# Patient Record
Sex: Female | Born: 1990 | Race: White | Hispanic: No | Marital: Married | Smoking: Current every day smoker
Health system: Southern US, Community
[De-identification: ages and names within clinical notes are randomized; demographics above are authoritative.]

## PROBLEM LIST (undated history)

## (undated) DIAGNOSIS — F99 Mental disorder, not otherwise specified: Secondary | ICD-10-CM

## (undated) DIAGNOSIS — E282 Polycystic ovarian syndrome: Secondary | ICD-10-CM

## (undated) HISTORY — DX: Polycystic ovarian syndrome: E28.2

## (undated) HISTORY — DX: Mental disorder, not otherwise specified: F99

---

## 2013-01-29 ENCOUNTER — Other Ambulatory Visit: Payer: Self-pay | Admitting: Adult Health

## 2013-03-25 ENCOUNTER — Other Ambulatory Visit: Payer: Self-pay | Admitting: Adult Health

## 2017-06-26 ENCOUNTER — Other Ambulatory Visit: Payer: Self-pay | Admitting: Women's Health

## 2017-06-26 ENCOUNTER — Other Ambulatory Visit (HOSPITAL_COMMUNITY)
Admission: RE | Admit: 2017-06-26 | Discharge: 2017-06-26 | Disposition: A | Discharge status: Home / Self Care | Payer: BLUE CROSS/BLUE SHIELD | Source: Ambulatory Visit | Attending: Obstetrics & Gynecology | Admitting: Obstetrics & Gynecology

## 2017-06-26 ENCOUNTER — Encounter: Payer: Self-pay | Admitting: Women's Health

## 2017-06-26 ENCOUNTER — Ambulatory Visit (INDEPENDENT_AMBULATORY_CARE_PROVIDER_SITE_OTHER): Payer: BLUE CROSS/BLUE SHIELD | Admitting: Women's Health

## 2017-06-26 ENCOUNTER — Encounter (INDEPENDENT_AMBULATORY_CARE_PROVIDER_SITE_OTHER): Payer: Self-pay

## 2017-06-26 VITALS — BP 134/80 | HR 85 | Ht 72.0 in | Wt 284.0 lb

## 2017-06-26 DIAGNOSIS — N911 Secondary amenorrhea: Secondary | ICD-10-CM | POA: Diagnosis not present

## 2017-06-26 DIAGNOSIS — R45851 Suicidal ideations: Secondary | ICD-10-CM | POA: Diagnosis not present

## 2017-06-26 DIAGNOSIS — Z01411 Encounter for gynecological examination (general) (routine) with abnormal findings: Secondary | ICD-10-CM | POA: Diagnosis not present

## 2017-06-26 DIAGNOSIS — Z113 Encounter for screening for infections with a predominantly sexual mode of transmission: Secondary | ICD-10-CM | POA: Insufficient documentation

## 2017-06-26 DIAGNOSIS — Z01419 Encounter for gynecological examination (general) (routine) without abnormal findings: Secondary | ICD-10-CM | POA: Insufficient documentation

## 2017-06-26 DIAGNOSIS — F418 Other specified anxiety disorders: Secondary | ICD-10-CM | POA: Diagnosis not present

## 2017-06-26 DIAGNOSIS — B373 Candidiasis of vulva and vagina: Secondary | ICD-10-CM

## 2017-06-26 DIAGNOSIS — N912 Amenorrhea, unspecified: Secondary | ICD-10-CM

## 2017-06-26 MED ORDER — CITALOPRAM HYDROBROMIDE 20 MG PO TABS: ORAL_TABLET | ORAL | 6 refills | Status: DC

## 2017-06-26 NOTE — Patient Instructions (Signed)
Major Depressive Disorder, Adult Major depressive disorder (MDD) is a mental health condition. It may also be called clinical depression or unipolar depression. MDD usually causes feelings of sadness, hopelessness, or helplessness. MDD can also cause physical symptoms. It can interfere with work, school, relationships, and other everyday activities. MDD may be mild, moderate, or severe. It may occur once (single episode major depressive disorder) or it may occur multiple times (recurrent major depressive disorder). What are the causes? The exact cause of this condition is not known. MDD is most likely caused by a combination of things, which may include:  Genetic factors. These are traits that are passed along from parent to child.  Individual factors. Your personality, your behavior, and the way you handle your thoughts and feelings may contribute to MDD. This includes personality traits and behaviors learned from others.  Physical factors, such as: ? Differences in the part of your brain that controls emotion. This part of your brain may be different than it is in people who do not have MDD. ? Long-term (chronic) medical or psychiatric illnesses.  Social factors. Traumatic experiences or major life changes may play a role in the development of MDD.  What increases the risk? This condition is more likely to develop in women. The following factors may also make you more likely to develop MDD:  A family history of depression.  Troubled family relationships.  Abnormally low levels of certain brain chemicals.  Traumatic events in childhood, especially abuse or the loss of a parent.  Being under a lot of stress, or long-term stress, especially from upsetting life experiences or losses.  A history of: ? Chronic physical illness. ? Other mental health disorders. ? Substance abuse.  Poor living conditions.  Experiencing social exclusion or discrimination on a regular basis.  What are  the signs or symptoms? The main symptoms of MDD typically include:  Constant depressed or irritable mood.  Loss of interest in things and activities.  MDD symptoms may also include:  Sleeping or eating too much or too little.  Unexplained weight change.  Fatigue or low energy.  Feelings of worthlessness or guilt.  Difficulty thinking clearly or making decisions.  Thoughts of suicide or of harming others.  Physical agitation or weakness.  Isolation.  Severe cases of MDD may also occur with other symptoms, such as:  Delusions or hallucinations, in which you imagine things that are not real (psychotic depression).  Low-level depression that lasts at least a year (chronic depression or persistent depressive disorder).  Extreme sadness and hopelessness (melancholic depression).  Trouble speaking and moving (catatonic depression).  How is this diagnosed? This condition may be diagnosed based on:  Your symptoms.  Your medical history, including your mental health history. This may involve tests to evaluate your mental health. You may be asked questions about your lifestyle, including any drug and alcohol use, and how long you have had symptoms of MDD.  A physical exam.  Blood tests to rule out other conditions.  You must have a depressed mood and at least four other MDD symptoms most of the day, nearly every day in the same 2-week timeframe before your health care provider can confirm a diagnosis of MDD. How is this treated? This condition is usually treated by mental health professionals, such as psychologists, psychiatrists, and clinical social workers. You may need more than one type of treatment. Treatment may include:  Psychotherapy. This is also called talk therapy or counseling. Types of psychotherapy include: ? Cognitive behavioral   therapy (CBT). This type of therapy teaches you to recognize unhealthy feelings, thoughts, and behaviors, and replace them with  positive thoughts and actions. ? Interpersonal therapy (IPT). This helps you to improve the way you relate to and communicate with others. ? Family therapy. This treatment includes members of your family.  Medicine to treat anxiety and depression, or to help you control certain emotions and behaviors.  Lifestyle changes, such as: ? Limiting alcohol and drug use. ? Exercising regularly. ? Getting plenty of sleep. ? Making healthy eating choices. ? Spending more time outdoors.  Treatments involving stimulation of the brain can be used in situations with extremely severe symptoms, or when medicine or other therapies do not work over time. These treatments include electroconvulsive therapy, transcranial magnetic stimulation, and vagal nerve stimulation. Follow these instructions at home: Activity  Return to your normal activities as told by your health care provider.  Exercise regularly and spend time outdoors as told by your health care provider. General instructions  Take over-the-counter and prescription medicines only as told by your health care provider.  Do not drink alcohol. If you drink alcohol, limit your alcohol intake to no more than 1 drink a day for nonpregnant women and 2 drinks a day for men. One drink equals 12 oz of beer, 5 oz of wine, or 1 oz of hard liquor. Alcohol can affect any antidepressant medicines you are taking. Talk to your health care provider about your alcohol use.  Eat a healthy diet and get plenty of sleep.  Find activities that you enjoy doing, and make time to do them.  Consider joining a support group. Your health care provider may be able to recommend a support group.  Keep all follow-up visits as told by your health care provider. This is important. Where to find more information: [employer] on Mental Illness  www.nami.org  [place] National Institute of Mental Health  https://carter.com/  National Suicide Prevention  Lifeline  1-800-273-TALK 202-208-2398). This is free, 24-hour help.  Contact a health care provider if:  Your symptoms get worse.  You develop new symptoms. Get help right away if:  You self-harm.  You have serious thoughts about hurting yourself or others.  You see, hear, taste, smell, or feel things that are not present (hallucinate). This information is not intended to replace advice given to you by your health care provider. Make sure you discuss any questions you have with your health care provider. Document Released: 12/21/2012 Document Revised: 05/02/2016 Document Reviewed: 03/06/2016 Elsevier Interactive Patient Education  2017 [place].   Generalized Anxiety Disorder, Adult Generalized anxiety disorder (GAD) is a mental health disorder. People with this condition constantly worry about everyday events. Unlike normal anxiety, worry related to GAD is not triggered by a specific event. These worries also do not fade or get better with time. GAD interferes with life functions, including relationships, work, and school. GAD can vary from mild to severe. People with severe GAD can have intense waves of anxiety with physical symptoms (panic attacks). What are the causes? The exact cause of GAD is not known. What increases the risk? This condition is more likely to develop in:  Women.  People who have a family history of anxiety disorders.  People who are very shy.  People who experience very stressful life events, such as the death of a loved one.  People who have a very stressful family environment.  What are the signs or symptoms? People with GAD often worry excessively about  many things in their lives, such as their health and family. They may also be overly concerned about:  Doing well at work.  Being on time.  Natural disasters.  Friendships.  Physical symptoms of GAD include:  Fatigue.  Muscle tension or having muscle twitches.  Trembling or feeling  shaky.  Being easily startled.  Feeling like your heart is pounding or racing.  Feeling out of breath or like you cannot take a deep breath.  Having trouble falling asleep or staying asleep.  Sweating.  Nausea, diarrhea, or irritable bowel syndrome (IBS).  Headaches.  Trouble concentrating or remembering facts.  Restlessness.  Irritability.  How is this diagnosed? Your health care provider can diagnose GAD based on your symptoms and medical history. You will also have a physical exam. The health care provider will ask specific questions about your symptoms, including how severe they are, when they started, and if they come and go. Your health care provider may ask you about your use of alcohol or drugs, including prescription medicines. Your health care provider may refer you to a mental health specialist for further evaluation. Your health care provider will do a thorough examination and may perform additional tests to rule out other possible causes of your symptoms. To be diagnosed with GAD, a person must have anxiety that:  Is out of his or her control.  Affects several different aspects of his or her life, such as work and relationships.  Causes distress that makes him or her unable to take part in normal activities.  Includes at least three physical symptoms of GAD, such as restlessness, fatigue, trouble concentrating, irritability, muscle tension, or sleep problems.  Before your health care provider can confirm a diagnosis of GAD, these symptoms must be present more days than they are not, and they must last for six months or longer. How is this treated? The following therapies are usually used to treat GAD:  Medicine. Antidepressant medicine is usually prescribed for long-term daily control. Antianxiety medicines may be added in severe cases, especially when panic attacks occur.  Talk therapy (psychotherapy). Certain types of talk therapy can be helpful in treating GAD  by providing support, education, and guidance. Options include: ? Cognitive behavioral therapy (CBT). People learn coping skills and techniques to ease their anxiety. They learn to identify unrealistic or negative thoughts and behaviors and to replace them with positive ones. ? Acceptance and commitment therapy (ACT). This treatment teaches people how to be mindful as a way to cope with unwanted thoughts and feelings. ? Biofeedback. This process trains you to manage your body's response (physiological response) through breathing techniques and relaxation methods. You will work with a therapist while machines are used to monitor your physical symptoms.  Stress management techniques. These include yoga, meditation, and exercise.  A mental health specialist can help determine which treatment is best for you. Some people see improvement with one type of therapy. However, other people require a combination of therapies. Follow these instructions at home:  Take over-the-counter and prescription medicines only as told by your health care provider.  Try to maintain a normal routine.  Try to anticipate stressful situations and allow extra time to manage them.  Practice any stress management or self-calming techniques as taught by your health care provider.  Do not punish yourself for setbacks or for not making progress.  Try to recognize your accomplishments, even if they are small.  Keep all follow-up visits as told by your health care provider. This is  important. Contact a health care provider if:  Your symptoms do not get better.  Your symptoms get worse.  You have signs of depression, such as: ? A persistently sad, cranky, or irritable mood. ? Loss of enjoyment in activities that used to bring you joy. ? Change in weight or eating. ? Changes in sleeping habits. ? Avoiding friends or family members. ? Loss of energy for normal tasks. ? Feelings of guilt or worthlessness. Get help right  away if:  You have serious thoughts about hurting yourself or others. If you ever feel like you may hurt yourself or others, or have thoughts about taking your own life, get help right away. You can go to your nearest emergency department or call:  Your local emergency services (911 in the [place]  A suicide crisis helpline, such as the National Suicide Prevention Lifeline at 1-800-273-8255. This is open 24 hours a day.  Summary  Generalized anxiety disorder (GAD) is a mental health disorder that involves worry that is not triggered by a specific event.  People with GAD often worry excessively about many things in their lives, such as their health and family.  GAD may cause physical symptoms such as restlessness, trouble concentrating, sleep problems, frequent sweating, nausea, diarrhea, headaches, and trembling or muscle twitching.  A mental health specialist can help determine which treatment is best for you. Some people see improvement with one type of therapy. However, other people require a combination of therapies. This information is not intended to replace advice given to you by your health care provider. Make sure you discuss any questions you have with your health care provider. Document Released: 12/21/2012 Document Revised: 07/16/2016 Document Reviewed: 07/16/2016 Elsevier Interactive Patient Education  2018 [employer]  

## 2017-06-26 NOTE — Progress Notes (Signed)
WELL-WOMAN EXAMINATION Patient name: Carolyn Bonilla MRN [id number]  Date of birth: 05-13-1991 Chief Complaint:   Gynecologic Exam and Amenorrhea (last period 12/22/15)  History of Present Illness:   Vercie Pokorny is a 26 y.o. G0P0000 Caucasian female being seen today for a routine well-woman exam.  Current complaints: menarche at 26yo, has only had 3 periods in 2 years, each spaced about apart. Last period 12/22/15. They last 4-5d, changes regular tampon q 4hrs, no clots, +cramping. Her older sister has never had a period. Not currently sexually active, but has been in the past.  Also reports depression/anxiety, was on meds and did counseling as teenager, nothing since. Doesn't find joy in things she used to, eats a lot, not able to sleep. Does have thoughts of harming herself, states she would not act on thoughts, has never attempted to harm self in past, has not developed a plan. Does want to try meds and counseling. Anxiety keeps her from coming to doctor/getting check-ups, etc.   Depression screen [facility] 2/9 06/26/2017  Decreased Interest 3  Down, Depressed, Hopeless 3  PHQ - 2 Score 6  Altered sleeping 3  Tired, decreased energy 3  Change in appetite 3  Feeling bad or failure about yourself  3  Trouble concentrating 1  Moving slowly or fidgety/restless 0  Suicidal thoughts 1  PHQ-9 Score 20  Difficult doing work/chores Somewhat difficult    PCP: Robbie Lis, hasn't been since she was younger      does desire labs Patient's last menstrual period was 12/22/2015. The current method of family planning is abstinence Last pap never. Results were: n/a Last mammogram: never. Results were: n/a Last colonoscopy: never. Results were: n/a  Review of Systems:   Pertinent items are noted in HPI Denies any headaches, blurred vision, fatigue, shortness of breath, chest pain, abdominal pain, abnormal vaginal discharge/itching/odor/irritation, problems with periods, bowel movements,  urination, or intercourse unless otherwise stated above. Pertinent History Reviewed:  Reviewed past medical,surgical, social and family history.  Reviewed problem list, medications and allergies. Physical Assessment:   Vitals:   06/26/17 1600  BP: 134/80  Pulse: 85  Weight: 284 lb (128.8 kg)  Height: 6' (1.829 m)  Body mass index is 38.52 kg/m.        Physical Examination:   General appearance - well appearing, and in no distress  Mental status - alert, oriented to person, place, and time  Psych:  She is very anxious, to point of slight hyperventilation and tearing up  Skin - warm and dry, normal color, no suspicious lesions noted  Chest - effort normal, all lung fields clear to auscultation bilaterally  Heart - normal rate and regular rhythm  Neck:  midline trachea, no thyromegaly or nodules  Breasts - breasts appear normal, no suspicious masses, no skin or nipple changes or  axillary nodes  Abdomen - soft, nontender, nondistended, no masses or organomegaly  Pelvic -   VULVA: entire mons very erythematous and demarcated- c/w yeast, painted w/ gentian violet   VAGINA: normal appearing vagina with normal color and discharge, no lesions  CERVIX: normal appearing cervix without discharge or lesions, no CMT  Thin prep pap is done w/ reflex HR HPV cotesting  UTERUS: unable to adequately assess d/t body habitus  ADNEXA: No adnexal masses or tenderness noted, unable to adequately assess d/t body habitus  Extremities:  No swelling or varicosities noted   Assessment & Plan:  1) Well-Woman Exam  2) Late menarche w/ oligomenorrhea  and subsequent secondary amenorrhea> will get pelvic u/s then discuss results/plan of care  3) Depression/anxiety w/ suicidal thoughts> no plan, promises not to act on thoughts, if feels she will she promises to go to ED. Rx celexa 10mg  daily x 7d then 20mg  daily thereafter, order placed for counseling/therapy w/ [facility]. Discussed stress/anxiety relieving   4)  Obesity> BMI 38  5) Vulvar candida> painted w/ gentian violet today   Labs/procedures today: labs listed below  Mammogram @26yo  or sooner if problems Colonoscopy @26yo  or sooner if problems  Orders Placed This Encounter  Procedures  . US PELVIS (TRANSABDOMINAL ONLY)  . US PELVIS TRANSVANGINAL NON-OB (TV ONLY)  . CBC  . Comprehensive metabolic panel  . TSH  . Hemoglobin A1c  . HIV antibody  . RPR  . Hepatitis B surface antigen  . Ambulatory referral to Behavioral Health    Follow-up: Return in about 2 weeks (around 07/10/2017) for US:GYN then f/u w/ me after.  Marge DuncansBooker, Will Heinkel Randall CNM, [facility] - Williams WayWHNP-BC 06/26/17 1630

## 2017-06-27 DIAGNOSIS — F418 Other specified anxiety disorders: Secondary | ICD-10-CM | POA: Insufficient documentation

## 2017-06-27 LAB — COMPREHENSIVE METABOLIC PANEL
A/G RATIO: 1.5 (ref 1.2–2.2)
ALK PHOS: 67 IU/L (ref 39–117)
ALT: 33 IU/L — AB (ref 0–32)
AST: 20 IU/L (ref 0–40)
Albumin: 4.6 g/dL (ref 3.5–5.5)
BILIRUBIN TOTAL: 0.5 mg/dL (ref 0.0–1.2)
BUN/Creatinine Ratio: 16 (ref 9–23)
BUN: 14 mg/dL (ref 6–20)
CHLORIDE: 104 mmol/L (ref 96–106)
CO2: 22 mmol/L (ref 20–29)
Calcium: 9.6 mg/dL (ref 8.7–10.2)
Creatinine, Ser: 0.85 mg/dL (ref 0.57–1.00)
GFR calc Af Amer: 110 mL/min/{1.73_m2} (ref >59)
GFR, EST NON AFRICAN AMERICAN: 96 mL/min/{1.73_m2} (ref >59)
GLOBULIN, TOTAL: 3 g/dL (ref 1.5–4.5)
Glucose: 81 mg/dL (ref 65–99)
POTASSIUM: 4.2 mmol/L (ref 3.5–5.2)
SODIUM: 143 mmol/L (ref 134–144)
Total Protein: 7.6 g/dL (ref 6.0–8.5)

## 2017-06-27 LAB — HEMOGLOBIN A1C
ESTIMATED AVERAGE GLUCOSE: 100 mg/dL
Hgb A1c MFr Bld: 5.1 % (ref 4.8–5.6)

## 2017-06-27 LAB — TSH: TSH: 0.709 u[IU]/mL (ref 0.450–4.500)

## 2017-06-27 LAB — CBC
HEMATOCRIT: 39.3 % (ref 34.0–46.6)
Hemoglobin: 13.1 g/dL (ref 11.1–15.9)
MCH: 27.8 pg (ref 26.6–33.0)
MCHC: 33.3 g/dL (ref 31.5–35.7)
MCV: 83 fL (ref 79–97)
Platelets: 313 10*3/uL (ref 150–379)
RBC: 4.72 x10E6/uL (ref 3.77–5.28)
RDW: 14.2 % (ref 12.3–15.4)
WBC: 10.4 10*3/uL (ref 3.4–10.8)

## 2017-06-27 LAB — RPR: RPR Ser Ql: NONREACTIVE

## 2017-06-27 LAB — HEPATITIS B SURFACE ANTIGEN: HEP B S AG: NEGATIVE

## 2017-06-27 LAB — HIV ANTIBODY (ROUTINE TESTING W REFLEX): HIV Screen 4th Generation wRfx: NONREACTIVE

## 2017-07-01 LAB — CYTOLOGY - PAP
Chlamydia: NEGATIVE
NEISSERIA GONORRHEA: NEGATIVE

## 2017-07-04 ENCOUNTER — Telehealth: Payer: Self-pay | Admitting: *Deleted

## 2017-07-04 NOTE — Telephone Encounter (Signed)
LMOVM to return call regarding pap results.  

## 2017-07-07 ENCOUNTER — Telehealth: Payer: Self-pay | Admitting: *Deleted

## 2017-07-07 NOTE — Telephone Encounter (Signed)
LMOVM for patient to callus back.  

## 2017-07-08 ENCOUNTER — Telehealth: Payer: Self-pay | Admitting: *Deleted

## 2017-07-08 NOTE — Telephone Encounter (Signed)
LMOVM to return call regarding appointment.

## 2017-07-08 NOTE — Telephone Encounter (Signed)
Informed patient of abnormal pap and need for colpo at next visit. Patient states she is fine to see Dr Emelda FearFerguson. Will move appointment.

## 2017-07-09 ENCOUNTER — Encounter: Payer: Self-pay | Admitting: Women's Health

## 2017-07-09 DIAGNOSIS — R87619 Unspecified abnormal cytological findings in specimens from cervix uteri: Secondary | ICD-10-CM | POA: Insufficient documentation

## 2017-07-10 ENCOUNTER — Other Ambulatory Visit: Payer: Self-pay | Admitting: Obstetrics and Gynecology

## 2017-07-10 ENCOUNTER — Ambulatory Visit: Payer: BLUE CROSS/BLUE SHIELD | Admitting: Adult Health

## 2017-07-10 ENCOUNTER — Ambulatory Visit (INDEPENDENT_AMBULATORY_CARE_PROVIDER_SITE_OTHER): Payer: BLUE CROSS/BLUE SHIELD | Admitting: Obstetrics and Gynecology

## 2017-07-10 ENCOUNTER — Ambulatory Visit (INDEPENDENT_AMBULATORY_CARE_PROVIDER_SITE_OTHER): Payer: BLUE CROSS/BLUE SHIELD

## 2017-07-10 VITALS — BP 130/66 | HR 88 | Ht 72.0 in | Wt 280.0 lb

## 2017-07-10 DIAGNOSIS — N912 Amenorrhea, unspecified: Secondary | ICD-10-CM | POA: Diagnosis not present

## 2017-07-10 DIAGNOSIS — R87612 Low grade squamous intraepithelial lesion on cytologic smear of cervix (LGSIL): Secondary | ICD-10-CM | POA: Diagnosis not present

## 2017-07-10 DIAGNOSIS — Z3202 Encounter for pregnancy test, result negative: Secondary | ICD-10-CM | POA: Diagnosis not present

## 2017-07-10 LAB — POCT URINE PREGNANCY: PREG TEST UR: NEGATIVE

## 2017-07-10 NOTE — Progress Notes (Signed)
PELVIC US TA/TV: homogeneous anteverted uterus,wnl,EEC 3.4 mm,slightly enlarged ovaries bilat w/peripherally located follicles,no free fluid,no pain during ultrasound,ovaries appear mobile

## 2017-07-10 NOTE — Progress Notes (Signed)
  Carolyn Bonilla Paige Braaksma 26 y.o. G0P0000 here for colposcopy for low-grade squamous intraepithelial neoplasia (LGSIL - encompassing HPV,mild dysplasia,CIN I) pap smear on 06/26/17. Discussed role for HPV in cervical dysplasia, need for surveillance.  Patient given informed consent, signed copy in the chart, time out was performed.  Placed in lithotomy position. Cervix viewed with speculum and colposcope after application of acetic acid.   Colposcopy adequate? Yes  no visible lesions, no mosaicism, no punctation and no abnormal vasculature; no biopsies obtained.  ECC specimen obtained. All specimens were labelled and sent to pathology.   Colposcopy IMPRESSION: No visible dysplasia ECC done F/u pap in 1 year  Patient was given post procedure instructions. Will follow up pathology and manage accordingly.  Routine preventative health maintenance measures emphasized.    By signing my name below, I, Izna Ahmed, attest that this documentation has been prepared under the direction and in the presence of Tilda BurrowFerguson, Taziyah Iannuzzi V., MD. Electronically Signed: Redge GainerIzna Ahmed, Medical Scribe. 07/10/17. 10:37 AM.  I personally performed the services described in this documentation, which was SCRIBED in my presence. The recorded information has been reviewed and considered accurate. It has been edited as necessary during review. Tilda BurrowFERGUSON,Britanee Vanblarcom V, MD

## 2017-07-22 ENCOUNTER — Encounter: Payer: Self-pay | Admitting: Women's Health

## 2017-07-22 ENCOUNTER — Ambulatory Visit (INDEPENDENT_AMBULATORY_CARE_PROVIDER_SITE_OTHER): Payer: BLUE CROSS/BLUE SHIELD | Admitting: Women's Health

## 2017-07-22 VITALS — BP 130/82 | HR 98 | Ht 72.0 in | Wt 279.6 lb

## 2017-07-22 DIAGNOSIS — N911 Secondary amenorrhea: Secondary | ICD-10-CM | POA: Diagnosis not present

## 2017-07-22 DIAGNOSIS — F418 Other specified anxiety disorders: Secondary | ICD-10-CM | POA: Diagnosis not present

## 2017-07-22 DIAGNOSIS — N913 Primary oligomenorrhea: Secondary | ICD-10-CM | POA: Diagnosis not present

## 2017-07-22 DIAGNOSIS — E282 Polycystic ovarian syndrome: Secondary | ICD-10-CM | POA: Diagnosis not present

## 2017-07-22 MED ORDER — MEDROXYPROGESTERONE ACETATE 10 MG PO TABS: 10.0000 mg | ORAL_TABLET | Freq: Every day | ORAL | 0 refills | Status: DC

## 2017-07-22 NOTE — Progress Notes (Signed)
GYN VISIT Patient name: Carolyn Bonilla MRN [id number]  Date of birth: 09/20/1990 Chief Complaint:   Follow-up (ultrasound done 07-10-17)  History of Present Illness:   Carolyn Bonilla is a 26 y.o. G0P0000 Caucasian female being seen today for f/u to discuss u/s results. Had pelvic u/s 07/10/17 due to menarche @ 26yo and only 3 periods in past 2 years, each about ago. Last period 12/22/15. Possibly desiring pregnancy in future. Is in relationship w/ transgender female, so pt would want to carry the pregnancy. Also had LSIL/HPV pap 06/26/17 w/ colpo w/ JVF on 07/10/17, normal colpo, ECC sample obtained and was benign. Also started pt on celexa  daily 06/26/17 d/t depression/anxiety w/ suicidal thoughts. Feeling much better. Denies SI/HI. Hasn't gone to counseling w/ [facility] as ordered, feels better.  Depression screen [facility] 2/9 07/22/2017 06/26/2017  Decreased Interest 0 3  Down, Depressed, Hopeless 0 3  PHQ - 2 Score 0 6  Altered sleeping 2 3  Tired, decreased energy 1 3  Change in appetite 1 3  Feeling bad or failure about yourself  0 3  Trouble concentrating 0 1  Moving slowly or fidgety/restless 0 0  Suicidal thoughts 0 1  PHQ-9 Score 4 20  Difficult doing work/chores Somewhat difficult Somewhat difficult    Pelvic u/s 07/10/17:  Chole Driver is a 26 y.o. G0P0000 LMP 12/28/2015,she is here for a pelvic sonogram for amenorrhea.  Uterus                      6.5 x 2.8 x 3.4 cm, vol 33 ml, homogeneous anteverted uterus,wnl, Endometrium          3.4 mm, symmetrical, wnl Right ovary             3.9 x 2.1 x 2.6 cm, 11 ml,enlarged w/peripherally located follicles Left ovary                2.5 x 3.5 x 2.1 cm, 9.7 ml,slightly enlarged w/peripherally located follicles  No free fluid   Technician Comments: PELVIC US TA/TV: homogeneous anteverted uterus,wnl,EEC 3.4 mm,slightly enlarged ovaries bilat w/peripherally located follicles,no free fluid,no pain during ultrasound,ovaries  appear mobile  [employer] 07/10/2017 10:43 AM Clinical Impression and recommendations:  I have reviewed the sonogram results above. Secondary amenorrhea in an obese female with history of late menarche Combined with the patient's current clinical course, below are my impressions and any appropriate recommendations for management based on the sonographic findings: 1. Tiny anteflexed uterus fundus of uterus appears was underdeveloped, measures 33 mL,, distinctly small with thin atrophic endometrium 2. Would consider FSH, TSH value prior to hormone manipulation 3. Consider progesterone withdrawal test, Provera 10 mg daily 14 days, to test end  organ function , uterus function FERGUSON,JOHN V    No LMP recorded. Patient is not currently having periods (Reason: Other). The current method of family planning is none. Last pap 06/26/17. Results were:  LSIL/HPV Review of Systems:   Pertinent items are noted in HPI Denies fever/chills, dizziness, headaches, visual disturbances, fatigue, shortness of breath, chest pain, abdominal pain, vomiting, abnormal vaginal discharge/itching/odor/irritation, problems with bowel movements, urination, or intercourse unless otherwise stated above.  Pertinent History Reviewed:  Reviewed past medical,surgical, social, obstetrical and family history.  Reviewed problem list, medications and allergies. Physical Assessment:   Vitals:   07/22/17 1028  BP: 130/82  Pulse: 98  Weight: 279 lb 9.6 oz (126.8 kg)  Height: 6' (1.829 m)  Body mass index is 37.92 kg/m.       Physical Examination:   General appearance: alert, well appearing, and in no distress  Mental status: alert, oriented to person, place, and time  Skin: warm & dry   Cardiovascular: normal heart rate noted   Respiratory: normal respiratory effort, no distress  Abdomen: soft, non-tender   Pelvic: examination not indicated  Extremities: no edema   No results found for this or any previous visit  (from the past 24 hour(s)).  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 Already checked TSH and A1C normal. Rx provera 10mg  daily x 14d. F/u 3wks, if bleeds- will either start coc's or cyclical provera q 3mths  2) PCOS> dx by oligo/amenorrhea and PCO on u/s  3) Dep/anx> much better on celexa 20mg  daily, no more SI  4) LSIL/HPV pap> w/ normal colpo and benign ECC, repeat pap 2873yr  Orders Placed This Encounter  Procedures  . Follicle stimulating hormone  . Prolactin   Return in about 3 weeks (around 08/12/2017) for F/U.  Marge DuncansBooker, [place] Randall CNM, [facility] 07/22/2017 12:38 PM

## 2017-07-22 NOTE — Patient Instructions (Signed)
Polycystic Ovarian Syndrome °Polycystic ovarian syndrome (PCOS) is a common hormonal disorder among women of reproductive age. In most women with PCOS, many small fluid-filled sacs (cysts) grow on the ovaries, and the cysts are not part of a normal menstrual cycle. PCOS can cause problems with your menstrual periods and make it difficult to get pregnant. It can also cause an increased risk of miscarriage with pregnancy. If it is not treated, PCOS can lead to serious health problems, such as diabetes and heart disease. °What are the causes? °The cause of PCOS is not known, but it may be the result of a combination of certain factors, such as: °· Irregular menstrual cycle. °· High levels of certain hormones (androgens). °· Problems with the hormone that helps to control blood sugar (insulin resistance). °· Certain genes. ° °What increases the risk? °This condition is more likely to develop in women who have a family history of PCOS. °What are the signs or symptoms? °Symptoms of PCOS may include: °· Multiple ovarian cysts. °· Infrequent periods or no periods. °· Periods that are too frequent or too heavy. °· Unpredictable periods. °· Inability to get pregnant (infertility) because of not ovulating. °· Increased growth of hair on the face, chest, stomach, back, thumbs, thighs, or toes. °· Acne or oily skin. Acne may develop during adulthood, and it may not respond to treatment. °· Pelvic pain. °· Weight gain or obesity. °· Patches of thickened and dark brown or black skin on the neck, arms, breasts, or thighs (acanthosis nigricans). °· Excess hair growth on the face, chest, abdomen, or upper thighs (hirsutism). ° °How is this diagnosed? °This condition is diagnosed based on: °· Your medical history. °· A physical exam, including a pelvic exam. Your health care provider may look for areas of increased hair growth on your skin. °· Tests, such as: °? Ultrasound. This may be used to examine the ovaries and the lining of the  uterus (endometrium) for cysts. °? Blood tests. These may be used to check levels of sugar (glucose), female hormone (testosterone), and female hormones (estrogen and progesterone) in your blood. ° °How is this treated? °There is no cure for PCOS, but treatment can help to manage symptoms and prevent more health problems from developing. Treatment varies depending on: °· Your symptoms. °· Whether you want to have a baby or whether you need birth control (contraception). ° °Treatment may include nutrition and lifestyle changes along with: °· Progesterone hormone to start a menstrual period. °· Birth control pills to help you have regular menstrual periods. °· Medicines to make you ovulate, if you want to get pregnant. °· Medicine to reduce excessive hair growth. °· Surgery, in severe cases. This may involve making small holes in one or both of your ovaries. This decreases the amount of testosterone that your body produces. ° °Follow these instructions at home: °· Take over-the-counter and prescription medicines only as told by your health care provider. °· Follow a healthy meal plan. This can help you reduce the effects of PCOS. °? Eat a healthy diet that includes lean proteins, complex carbohydrates, fresh fruits and vegetables, low-fat dairy products, and healthy fats. Make sure to eat enough fiber. °· If you are overweight, lose weight as told by your health care provider. °? Losing 10% of your body weight may improve symptoms. °? Your health care provider can determine how much weight loss is best for you and can help you lose weight safely. °· Keep all follow-up visits as told by   your health care provider. This is important. °Contact a health care provider if: °· Your symptoms do not get better with medicine. °· You develop new symptoms. °This information is not intended to replace advice given to you by your health care provider. Make sure you discuss any questions you have with your health care  provider. °Document Released: 12/20/2004 Document Revised: 04/23/2016 Document Reviewed: 02/11/2016 °Elsevier Interactive Patient Education © 2018 Elsevier Inc. ° °

## 2017-07-23 LAB — PROLACTIN: Prolactin: 17.5 ng/mL (ref 4.8–23.3)

## 2017-07-23 LAB — FOLLICLE STIMULATING HORMONE: FSH: 6.8 m[IU]/mL

## 2017-08-14 ENCOUNTER — Other Ambulatory Visit: Payer: Self-pay

## 2017-08-14 ENCOUNTER — Encounter: Payer: Self-pay | Admitting: Women's Health

## 2017-08-14 ENCOUNTER — Ambulatory Visit (INDEPENDENT_AMBULATORY_CARE_PROVIDER_SITE_OTHER): Payer: BLUE CROSS/BLUE SHIELD | Admitting: Women's Health

## 2017-08-14 VITALS — BP 120/82 | HR 89 | Ht 72.0 in | Wt 277.0 lb

## 2017-08-14 DIAGNOSIS — N911 Secondary amenorrhea: Secondary | ICD-10-CM | POA: Diagnosis not present

## 2017-08-14 DIAGNOSIS — N913 Primary oligomenorrhea: Secondary | ICD-10-CM

## 2017-08-14 DIAGNOSIS — E282 Polycystic ovarian syndrome: Secondary | ICD-10-CM

## 2017-08-14 MED ORDER — DESOGESTREL-ETHINYL ESTRADIOL 0.15-30 MG-MCG PO TABS: 1.0000 | ORAL_TABLET | Freq: Every day | ORAL | 3 refills | Status: DC

## 2017-08-14 NOTE — Patient Instructions (Signed)
Oral Contraception Use Oral contraceptive pills (OCPs) are medicines taken to prevent pregnancy. OCPs work by preventing the ovaries from releasing eggs. The hormones in OCPs also cause the cervical mucus to thicken, preventing the sperm from entering the uterus. The hormones also cause the uterine lining to become thin, not allowing a fertilized egg to attach to the inside of the uterus. OCPs are highly effective when taken exactly as prescribed. However, OCPs do not prevent sexually transmitted diseases (STDs). Safe sex practices, such as using condoms along with an OCP, can help prevent STDs. Before taking OCPs, you may have a physical exam and Pap test. Your health care provider may also order blood tests if necessary. Your health care provider will make sure you are a good candidate for oral contraception. Discuss with your health care provider the possible side effects of the OCP you may be prescribed. When starting an OCP, it can take 2 to 3 months for the body to adjust to the changes in hormone levels in your body. How to take oral contraceptive pills Your health care provider may advise you on how to start taking the first cycle of OCPs. Otherwise, you can:  Start on day 1 of your menstrual period. You will not need any backup contraceptive protection with this start time.  Start on the first Sunday after your menstrual period or the day you get your prescription. In these cases, you will need to use backup contraceptive protection for the first week.  Start the pill at any time of your cycle. If you take the pill within 5 days of the start of your period, you are protected against pregnancy right away. In this case, you will not need a backup form of birth control. If you start at any other time of your menstrual cycle, you will need to use another form of birth control for 7 days. If your OCP is the type called a minipill, it will protect you from pregnancy after taking it for 2 days (48  hours).  After you have started taking OCPs:  If you forget to take 1 pill, take it as soon as you remember. Take the next pill at the regular time.  If you miss 2 or more pills, call your health care provider because different pills have different instructions for missed doses. Use backup birth control until your next menstrual period starts.  If you use a 28-day pack that contains inactive pills and you miss 1 of the last 7 pills (pills with no hormones), it will not matter. Throw away the rest of the non-hormone pills and start a new pill pack.  No matter which day you start the OCP, you will always start a new pack on that same day of the week. Have an extra pack of OCPs and a backup contraceptive method available in case you miss some pills or lose your OCP pack. Follow these instructions at home:  Do not smoke.  Always use a condom to protect against STDs. OCPs do not protect against STDs.  Use a calendar to mark your menstrual period days.  Read the information and directions that came with your OCP. Talk to your health care provider if you have questions. Contact a health care provider if:  You develop nausea and vomiting.  You have abnormal vaginal discharge or bleeding.  You develop a rash.  You miss your menstrual period.  You are losing your hair.  You need treatment for mood swings or depression.  You   get dizzy when taking the OCP.  You develop acne from taking the OCP.  You become pregnant. Get help right away if:  You develop chest pain.  You develop shortness of breath.  You have an uncontrolled or severe headache.  You develop numbness or slurred speech.  You develop visual problems.  You develop pain, redness, and swelling in the legs. This information is not intended to replace advice given to you by your health care provider. Make sure you discuss any questions you have with your health care provider. Document Released: 08/15/2011 Document  Revised: 02/01/2016 Document Reviewed: 02/14/2013 Elsevier Interactive Patient Education  2017 Elsevier Inc.  

## 2017-08-14 NOTE — Progress Notes (Signed)
   GYN VISIT Patient name: Carolyn Bonilla MRN [id number]  Date of birth: 02/20/1991 Chief Complaint:   Follow-up  History of Present Illness:   Carolyn Bonilla is a 26 y.o. G0P0000 Caucasian female being seen today for f/u. She has late menarche at age 26, with oligomenorrhea and subsequent secondary amenorrhea, w/ PCOS. Last period was 12/22/15. Pelvic u/s showed underdeveloped uterus, and polycystic ovaries. All labs including FSH, prolactin, TSH, and A1C normal. After discussing w/ JVF and LHE, she was started on provera 10mg  x 14d at last visit on 11/13. States she did have a 1 week period that started 2d after she stopped the provera. Changed tampon q 2hrs, no clots, did have a lot of cramping. Per MD recommendation, next step is COCs or cyclical provera q 3mths. Pt wants COCs. Does smoke. No h/o HTN, DVT/PE, CVA, MI, or migraines w/ aura.  Doing well on celexa for dep/anx, feels much better.   No LMP recorded. Patient is not currently having periods (Reason: Other). The current method of family planning is none. Last pap 06/26/17. Results were:  LSIL/HPV w/ normal colpo Review of Systems:   Pertinent items are noted in HPI Denies fever/chills, dizziness, headaches, visual disturbances, fatigue, shortness of breath, chest pain, abdominal pain, vomiting, abnormal vaginal discharge/itching/odor/irritation, problems with periods, bowel movements, urination, or intercourse unless otherwise stated above.  Pertinent History Reviewed:  Reviewed past medical,surgical, social, obstetrical and family history.  Reviewed problem list, medications and allergies. Physical Assessment:   Vitals:   08/14/17 1037  BP: 120/82  Pulse: 89  Weight: 277 lb (125.6 kg)  Height: 6' (1.829 m)  Body mass index is 37.57 kg/m.       Physical Examination:   General appearance: alert, well appearing, and in no distress  Mental status: alert, oriented to person, place, and time  Skin: warm & dry    Cardiovascular: normal heart rate noted  Respiratory: normal respiratory effort, no distress  Abdomen: exam not indicateed  Pelvic: examination not indicated  Extremities: no edema   No results found for this or any previous visit (from the past 24 hour(s)).  Assessment & Plan:  1) Late menarche w/ oligomenorrhea>then subsequent amenorrhea> responded well to 14d provera challenge. Will begin COCs in attempt to start regular monthly periods- rx desogestrel/EE 30mcg COC 3pk w/ 3RF, f/u in 3mths  2) PCOS  3) Dep/anx> continue celexa 20mg  daily  No orders of the defined types were placed in this encounter.   Return in about 3 months (around 11/12/2017) for F/U.  Marge DuncansBooker, [name] CNM, [facility]NP-BC 08/14/2017 11:19 AM

## 2017-09-25 ENCOUNTER — Ambulatory Visit: Payer: BLUE CROSS/BLUE SHIELD | Admitting: Women's Health

## 2017-10-03 ENCOUNTER — Ambulatory Visit: Payer: BLUE CROSS/BLUE SHIELD | Admitting: Women's Health

## 2017-10-09 ENCOUNTER — Ambulatory Visit (INDEPENDENT_AMBULATORY_CARE_PROVIDER_SITE_OTHER): Payer: BLUE CROSS/BLUE SHIELD | Admitting: Women's Health

## 2017-10-09 ENCOUNTER — Encounter: Payer: Self-pay | Admitting: Women's Health

## 2017-10-09 VITALS — BP 120/80 | HR 99 | Ht 72.0 in | Wt 288.0 lb

## 2017-10-09 DIAGNOSIS — F418 Other specified anxiety disorders: Secondary | ICD-10-CM

## 2017-10-09 DIAGNOSIS — N911 Secondary amenorrhea: Secondary | ICD-10-CM | POA: Diagnosis not present

## 2017-10-09 MED ORDER — BUSPIRONE HCL 5 MG PO TABS: 5.0000 mg | ORAL_TABLET | Freq: Three times a day (TID) | ORAL | 6 refills | Status: DC

## 2017-10-09 NOTE — Patient Instructions (Signed)
Generalized Anxiety Disorder, Adult Generalized anxiety disorder (GAD) is a mental health disorder. People with this condition constantly worry about everyday events. Unlike normal anxiety, worry related to GAD is not triggered by a specific event. These worries also do not fade or get better with time. GAD interferes with life functions, including relationships, work, and school. GAD can vary from mild to severe. People with severe GAD can have intense waves of anxiety with physical symptoms (panic attacks). What are the causes? The exact cause of GAD is not known. What increases the risk? This condition is more likely to develop in:  Women.  People who have a family history of anxiety disorders.  People who are very shy.  People who experience very stressful life events, such as the death of a loved one.  People who have a very stressful family environment.  What are the signs or symptoms? People with GAD often worry excessively about many things in their lives, such as their health and family. They may also be overly concerned about:  Doing well at work.  Being on time.  Natural disasters.  Friendships.  Physical symptoms of GAD include:  Fatigue.  Muscle tension or having muscle twitches.  Trembling or feeling shaky.  Being easily startled.  Feeling like your heart is pounding or racing.  Feeling out of breath or like you cannot take a deep breath.  Having trouble falling asleep or staying asleep.  Sweating.  Nausea, diarrhea, or irritable bowel syndrome (IBS).  Headaches.  Trouble concentrating or remembering facts.  Restlessness.  Irritability.  How is this diagnosed? Your health care provider can diagnose GAD based on your symptoms and medical history. You will also have a physical exam. The health care provider will ask specific questions about your symptoms, including how severe they are, when they started, and if they come and go. Your health care  provider may ask you about your use of alcohol or drugs, including prescription medicines. Your health care provider may refer you to a mental health specialist for further evaluation. Your health care provider will do a thorough examination and may perform additional tests to rule out other possible causes of your symptoms. To be diagnosed with GAD, a person must have anxiety that:  Is out of his or her control.  Affects several different aspects of his or her life, such as work and relationships.  Causes distress that makes him or her unable to take part in normal activities.  Includes at least three physical symptoms of GAD, such as restlessness, fatigue, trouble concentrating, irritability, muscle tension, or sleep problems.  Before your health care provider can confirm a diagnosis of GAD, these symptoms must be present more days than they are not, and they must last for six months or longer. How is this treated? The following therapies are usually used to treat GAD:  Medicine. Antidepressant medicine is usually prescribed for long-term daily control. Antianxiety medicines may be added in severe cases, especially when panic attacks occur.  Talk therapy (psychotherapy). Certain types of talk therapy can be helpful in treating GAD by providing support, education, and guidance. Options include: ? Cognitive behavioral therapy (CBT). People learn coping skills and techniques to ease their anxiety. They learn to identify unrealistic or negative thoughts and behaviors and to replace them with positive ones. ? Acceptance and commitment therapy (ACT). This treatment teaches people how to be mindful as a way to cope with unwanted thoughts and feelings. ? Biofeedback. This process trains you to   manage your body's response (physiological response) through breathing techniques and relaxation methods. You will work with a therapist while machines are used to monitor your physical symptoms.  Stress  management techniques. These include yoga, meditation, and exercise.  A mental health specialist can help determine which treatment is best for you. Some people see improvement with one type of therapy. However, other people require a combination of therapies. Follow these instructions at home:  Take over-the-counter and prescription medicines only as told by your health care provider.  Try to maintain a normal routine.  Try to anticipate stressful situations and allow extra time to manage them.  Practice any stress management or self-calming techniques as taught by your health care provider.  Do not punish yourself for setbacks or for not making progress.  Try to recognize your accomplishments, even if they are small.  Keep all follow-up visits as told by your health care provider. This is important. Contact a health care provider if:  Your symptoms do not get better.  Your symptoms get worse.  You have signs of depression, such as: ? A persistently sad, cranky, or irritable mood. ? Loss of enjoyment in activities that used to bring you joy. ? Change in weight or eating. ? Changes in sleeping habits. ? Avoiding friends or family members. ? Loss of energy for normal tasks. ? Feelings of guilt or worthlessness. Get help right away if:  You have serious thoughts about hurting yourself or others. If you ever feel like you may hurt yourself or others, or have thoughts about taking your own life, get help right away. You can go to your nearest emergency department or call:  Your local emergency services (911 in the [place]  A suicide crisis helpline, such as the National Suicide Prevention Lifeline at 1-800-273-8255. This is open 24 hours a day.  Summary  Generalized anxiety disorder (GAD) is a mental health disorder that involves worry that is not triggered by a specific event.  People with GAD often worry excessively about many things in their lives, such as their health and  family.  GAD may cause physical symptoms such as restlessness, trouble concentrating, sleep problems, frequent sweating, nausea, diarrhea, headaches, and trembling or muscle twitching.  A mental health specialist can help determine which treatment is best for you. Some people see improvement with one type of therapy. However, other people require a combination of therapies. This information is not intended to replace advice given to you by your health care provider. Make sure you discuss any questions you have with your health care provider. Document Released: 12/21/2012 Document Revised: 07/16/2016 Document Reviewed: 07/16/2016 Elsevier Interactive Patient Education  2018 Elsevier Inc. Buspirone tablets What is this medicine? BUSPIRONE (byoo SPYE rone) is used to treat anxiety disorders. This medicine may be used for other purposes; ask your health care provider or pharmacist if you have questions. COMMON BRAND NAME(S): BuSpar What should I tell my health care provider before I take this medicine? They need to know if you have any of these conditions: -kidney or liver disease -an unusual or allergic reaction to buspirone, other medicines, foods, dyes, or preservatives -pregnant or trying to get pregnant -breast-feeding How should I use this medicine? Take this medicine by mouth with a glass of water. Follow the directions on the prescription label. You may take this medicine with or without food. To ensure that this medicine always works the same way for you, you should take it either always with or always without food.   Take your doses at regular intervals. Do not take your medicine more often than directed. Do not stop taking except on the advice of your doctor or health care professional. Talk to your pediatrician regarding the use of this medicine in children. Special care may be needed. Overdosage: If you think you have taken too much of this medicine contact a poison control center or  emergency room at once. NOTE: This medicine is only for you. Do not share this medicine with others. What if I miss a dose? If you miss a dose, take it as soon as you can. If it is almost time for your next dose, take only that dose. Do not take double or extra doses. What may interact with this medicine? Do not take this medicine with any of the following medications: -linezolid -MAOIs like Carbex, Eldepryl, Marplan, Nardil, and Parnate -methylene blue -procarbazine This medicine may also interact with the following medications: -diazepam -digoxin -diltiazem -erythromycin -grapefruit juice -haloperidol -medicines for mental depression or mood problems -medicines for seizures like carbamazepine, phenobarbital and phenytoin -nefazodone -other medications for anxiety -rifampin -ritonavir -some antifungal medicines like itraconazole, ketoconazole, and voriconazole -verapamil -warfarin This list may not describe all possible interactions. Give your health care provider a list of all the medicines, herbs, non-prescription drugs, or dietary supplements you use. Also tell them if you smoke, drink alcohol, or use illegal drugs. Some items may interact with your medicine. What should I watch for while using this medicine? Visit your doctor or health care professional for regular checks on your progress. It may take 1 to 2 weeks before your anxiety gets better. You may get drowsy or dizzy. Do not drive, use machinery, or do anything that needs mental alertness until you know how this drug affects you. Do not stand or sit up quickly, especially if you are an older patient. This reduces the risk of dizzy or fainting spells. Alcohol can make you more drowsy and dizzy. Avoid alcoholic drinks. What side effects may I notice from receiving this medicine? Side effects that you should report to your doctor or health care professional as soon as possible: -blurred vision or other vision changes -chest  pain -confusion -difficulty breathing -feelings of hostility or anger -muscle aches and pains -numbness or tingling in hands or feet -ringing in the ears -skin rash and itching -vomiting -weakness Side effects that usually do not require medical attention (report to your doctor or health care professional if they continue or are bothersome): -disturbed dreams, nightmares -headache -nausea -restlessness or nervousness -sore throat and nasal congestion -stomach upset This list may not describe all possible side effects. Call your doctor for medical advice about side effects. You may report side effects to FDA at 1-800-FDA-1088. Where should I keep my medicine? Keep out of the reach of children. Store at room temperature below 30 degrees C (86 degrees F). Protect from light. Keep container tightly closed. Throw away any unused medicine after the expiration date. NOTE: This sheet is a summary. It may not cover all possible information. If you have questions about this medicine, talk to your doctor, pharmacist, or health care provider.  2018 Elsevier/Gold Standard (2010-04-05 18:06:11)  

## 2017-10-09 NOTE — Progress Notes (Signed)
   GYN VISIT Patient name: Carolyn Bonilla MRN [id number]  Date of birth: 02/04/1991 Chief Complaint:   Follow-up (on Medications)  History of Present Illness:   Carolyn Bonilla is a 27 y.o. G0P0000 Caucasian female being seen today for f/u of secondary amenorrhea. She was started on coc's 08/14/17, she has had a regular period q month since! Was started on celexa 20mg  daily 06/26/17 d/t dep/anx, had been working well until she started the coc's. States she began to have suicidal ideations, so she stopped the celexa, and suicidal ideations have completely went away. Declines counseling as it is hard to schedule w/ work. Does feel like she needs to be on something for the anxiety. States she talked to her sister and she is on Buspar which works well for her, pt wants to try.      Patient's last menstrual period was 10/08/2017. The current method of family planning is OCP (estrogen/progesterone). Last pap 06/26/17. Results were:  LSIL/HPV w/ normal colpo Review of Systems:   Pertinent items are noted in HPI Denies fever/chills, dizziness, headaches, visual disturbances, fatigue, shortness of breath, chest pain, abdominal pain, vomiting, abnormal vaginal discharge/itching/odor/irritation, problems with periods, bowel movements, urination, or intercourse unless otherwise stated above.  Pertinent History Reviewed:  Reviewed past medical,surgical, social, obstetrical and family history.  Reviewed problem list, medications and allergies. Physical Assessment:   Vitals:   10/09/17 1152  BP: 120/80  Pulse: 99  Weight: 288 lb (130.6 kg)  Height: 6' (1.829 m)  Body mass index is 39.06 kg/m.       Physical Examination:   General appearance: alert, well appearing, and in no distress  Mental status: alert, oriented to person, place, and time  Skin: warm & dry   Cardiovascular: normal heart rate noted  Respiratory: normal respiratory effort, no distress  Abdomen: soft, non-tender   Pelvic:  examination not indicated  Extremities: no edema   No results found for this or any previous visit (from the past 24 hour(s)).  Assessment & Plan:  1) Secondary amenorrhea> resolved w/ COCs, continue  2) Dep/anxiety> do not restart celexa, rx buspar 5mg  BID-TID, f/u 4wks. Stop taking/let me know if suicidal ideations return. Declines counseling.   Meds:  Meds ordered this encounter  Medications  . busPIRone (BUSPAR) 5 MG tablet    Sig: Take 1 tablet (5 mg total) by mouth 3 (three) times daily.    Dispense:  90 tablet    Refill:  6    Order Specific Question:   Supervising Provider    Answer:   Duane LopeEURE, LUTHER H [[id number]]    No orders of the defined types were placed in this encounter.   Return in about 4 weeks (around 11/06/2017) for F/U.  Marge DuncansBooker, Kimberly Randall CNM, [facility]NP-BC 10/09/2017 12:19 PM

## 2017-11-12 ENCOUNTER — Ambulatory Visit: Payer: BLUE CROSS/BLUE SHIELD | Admitting: Women's Health

## 2017-11-17 ENCOUNTER — Other Ambulatory Visit: Payer: Self-pay | Admitting: Women's Health

## 2017-11-17 MED ORDER — BUSPIRONE HCL 10 MG PO TABS: 5.0000 mg | ORAL_TABLET | Freq: Three times a day (TID) | ORAL | 3 refills | Status: DC

## 2018-01-01 ENCOUNTER — Ambulatory Visit: Payer: BLUE CROSS/BLUE SHIELD | Admitting: Women's Health

## 2018-01-07 ENCOUNTER — Ambulatory Visit: Payer: BLUE CROSS/BLUE SHIELD | Admitting: Women's Health

## 2018-04-23 ENCOUNTER — Emergency Department (HOSPITAL_COMMUNITY)
Admission: EM | Admit: 2018-04-23 | Discharge: 2018-04-24 | Disposition: A | Discharge status: Other Acute Inpatient Hospital | Payer: BLUE CROSS/BLUE SHIELD | Attending: Emergency Medicine | Admitting: Emergency Medicine

## 2018-04-23 ENCOUNTER — Other Ambulatory Visit: Payer: Self-pay

## 2018-04-23 DIAGNOSIS — F332 Major depressive disorder, recurrent severe without psychotic features: Secondary | ICD-10-CM | POA: Insufficient documentation

## 2018-04-23 DIAGNOSIS — R45851 Suicidal ideations: Secondary | ICD-10-CM

## 2018-04-23 DIAGNOSIS — F1220 Cannabis dependence, uncomplicated: Secondary | ICD-10-CM | POA: Insufficient documentation

## 2018-04-23 DIAGNOSIS — F17210 Nicotine dependence, cigarettes, uncomplicated: Secondary | ICD-10-CM | POA: Insufficient documentation

## 2018-04-23 DIAGNOSIS — Z79899 Other long term (current) drug therapy: Secondary | ICD-10-CM | POA: Diagnosis not present

## 2018-04-23 DIAGNOSIS — F329 Major depressive disorder, single episode, unspecified: Secondary | ICD-10-CM | POA: Diagnosis present

## 2018-04-24 ENCOUNTER — Encounter (HOSPITAL_COMMUNITY): Payer: Self-pay | Admitting: *Deleted

## 2018-04-24 ENCOUNTER — Other Ambulatory Visit: Payer: Self-pay

## 2018-04-24 ENCOUNTER — Inpatient Hospital Stay (HOSPITAL_COMMUNITY)
Admission: AD | Admit: 2018-04-24 | Discharge: 2018-04-27 | DRG: 885 | Disposition: A | Discharge status: Home / Self Care | Payer: BLUE CROSS/BLUE SHIELD | Source: Intra-hospital | Attending: Psychiatry | Admitting: Psychiatry

## 2018-04-24 DIAGNOSIS — F410 Panic disorder [episodic paroxysmal anxiety] without agoraphobia: Secondary | ICD-10-CM | POA: Diagnosis present

## 2018-04-24 DIAGNOSIS — F332 Major depressive disorder, recurrent severe without psychotic features: Secondary | ICD-10-CM | POA: Diagnosis present

## 2018-04-24 DIAGNOSIS — F339 Major depressive disorder, recurrent, unspecified: Secondary | ICD-10-CM | POA: Diagnosis present

## 2018-04-24 DIAGNOSIS — Z79890 Hormone replacement therapy (postmenopausal): Secondary | ICD-10-CM

## 2018-04-24 DIAGNOSIS — E282 Polycystic ovarian syndrome: Secondary | ICD-10-CM | POA: Diagnosis present

## 2018-04-24 DIAGNOSIS — F411 Generalized anxiety disorder: Secondary | ICD-10-CM | POA: Diagnosis present

## 2018-04-24 DIAGNOSIS — R45851 Suicidal ideations: Secondary | ICD-10-CM | POA: Diagnosis present

## 2018-04-24 DIAGNOSIS — F1290 Cannabis use, unspecified, uncomplicated: Secondary | ICD-10-CM | POA: Diagnosis present

## 2018-04-24 DIAGNOSIS — Z79899 Other long term (current) drug therapy: Secondary | ICD-10-CM | POA: Diagnosis not present

## 2018-04-24 DIAGNOSIS — F17210 Nicotine dependence, cigarettes, uncomplicated: Secondary | ICD-10-CM | POA: Diagnosis present

## 2018-04-24 DIAGNOSIS — G4700 Insomnia, unspecified: Secondary | ICD-10-CM | POA: Diagnosis present

## 2018-04-24 DIAGNOSIS — Z818 Family history of other mental and behavioral disorders: Secondary | ICD-10-CM | POA: Diagnosis not present

## 2018-04-24 LAB — COMPREHENSIVE METABOLIC PANEL
ALBUMIN: 3.4 g/dL — AB (ref 3.5–5.0)
ALK PHOS: 50 U/L (ref 38–126)
ALT: 29 U/L (ref 0–44)
AST: 23 U/L (ref 15–41)
Anion gap: 9 (ref 5–15)
BUN: 9 mg/dL (ref 6–20)
CALCIUM: 9.3 mg/dL (ref 8.9–10.3)
CHLORIDE: 110 mmol/L (ref 98–111)
CO2: 23 mmol/L (ref 22–32)
CREATININE: 0.8 mg/dL (ref 0.44–1.00)
GFR calc Af Amer: >60 mL/min (ref >60)
GFR calc non Af Amer: >60 mL/min (ref >60)
GLUCOSE: 103 mg/dL — AB (ref 70–99)
Potassium: 4.2 mmol/L (ref 3.5–5.1)
SODIUM: 142 mmol/L (ref 135–145)
Total Bilirubin: 0.4 mg/dL (ref 0.3–1.2)
Total Protein: 7 g/dL (ref 6.5–8.1)

## 2018-04-24 LAB — RAPID URINE DRUG SCREEN, HOSP PERFORMED
AMPHETAMINES: NOT DETECTED
BARBITURATES: NOT DETECTED
Benzodiazepines: NOT DETECTED
Cocaine: NOT DETECTED
Opiates: NOT DETECTED
Tetrahydrocannabinol: POSITIVE — AB

## 2018-04-24 LAB — CBC
HEMATOCRIT: 40.8 % (ref 36.0–46.0)
HEMOGLOBIN: 12.9 g/dL (ref 12.0–15.0)
MCH: 27.3 pg (ref 26.0–34.0)
MCHC: 31.6 g/dL (ref 30.0–36.0)
MCV: 86.3 fL (ref 78.0–100.0)
Platelets: 325 10*3/uL (ref 150–400)
RBC: 4.73 MIL/uL (ref 3.87–5.11)
RDW: 13.5 % (ref 11.5–15.5)
WBC: 9.5 10*3/uL (ref 4.0–10.5)

## 2018-04-24 LAB — I-STAT BETA HCG BLOOD, ED (MC, WL, AP ONLY): I-stat hCG, quantitative: <5 m[IU]/mL (ref <5)

## 2018-04-24 LAB — SALICYLATE LEVEL: Salicylate Lvl: <7 mg/dL (ref 2.8–30.0)

## 2018-04-24 LAB — ETHANOL: Alcohol, Ethyl (B): <10 mg/dL (ref <10)

## 2018-04-24 LAB — ACETAMINOPHEN LEVEL: Acetaminophen (Tylenol), Serum: <10 ug/mL — ABNORMAL LOW (ref 10–30)

## 2018-04-24 MED ORDER — HYDROXYZINE HCL 25 MG PO TABS: 25.0000 mg | ORAL_TABLET | Freq: Three times a day (TID) | ORAL | Status: DC | PRN

## 2018-04-24 MED ORDER — HYDROXYZINE HCL 25 MG PO TABS
25.0000 mg | ORAL_TABLET | Freq: Four times a day (QID) | ORAL | Status: DC | PRN
  Administered 2018-04-24 – 2018-04-26 (×3): 25 mg via ORAL
  Filled 2018-04-24 (×3): qty 1

## 2018-04-24 MED ORDER — TRAZODONE HCL 50 MG PO TABS: 50.0000 mg | ORAL_TABLET | Freq: Every evening | ORAL | Status: DC | PRN

## 2018-04-24 MED ORDER — TRAZODONE HCL 50 MG PO TABS
50.0000 mg | ORAL_TABLET | Freq: Every evening | ORAL | Status: DC | PRN
  Administered 2018-04-24 – 2018-04-25 (×2): 50 mg via ORAL
  Filled 2018-04-24 (×2): qty 1

## 2018-04-24 MED ORDER — MAGNESIUM HYDROXIDE 400 MG/5ML PO SUSP: 30.0000 mL | Freq: Every day | ORAL | Status: DC | PRN

## 2018-04-24 MED ORDER — ACETAMINOPHEN 325 MG PO TABS
650.0000 mg | ORAL_TABLET | Freq: Four times a day (QID) | ORAL | Status: DC | PRN
  Administered 2018-04-24: 650 mg via ORAL
  Filled 2018-04-24: qty 2

## 2018-04-24 MED ORDER — ALUM & MAG HYDROXIDE-SIMETH 200-200-20 MG/5ML PO SUSP: 30.0000 mL | ORAL | Status: DC | PRN

## 2018-04-24 MED ORDER — ACETAMINOPHEN 325 MG PO TABS: 650.0000 mg | ORAL_TABLET | Freq: Four times a day (QID) | ORAL | Status: DC | PRN

## 2018-04-24 MED ORDER — VENLAFAXINE HCL ER 37.5 MG PO CP24
37.5000 mg | ORAL_CAPSULE | Freq: Every day | ORAL | Status: DC
  Administered 2018-04-24 – 2018-04-25 (×2): 37.5 mg via ORAL
  Filled 2018-04-24 (×5): qty 1

## 2018-04-24 NOTE — ED Notes (Signed)
Regular diet ordered for lunch 

## 2018-04-24 NOTE — ED Notes (Signed)
[facility]-Monique,RN

## 2018-04-24 NOTE — Progress Notes (Signed)
Patient ID: Carolyn Bonilla, female   DOB: 09/07/1991, 27 y.o.   MRN: [id number] Patient is a 27 yo caucasian female [facility]- [facility] helplessness and hopelessness .She denis ever being hospitalized before for a mental health reason. She is tearful, picking at the sheet and , crying and she looks down at her chest, she says " I can't go on like this". WHen asked by this writer about her homelife, she explains" my partner and I ..we're going  through a rough patch..she has PTSD and she's struggling herslef right now. " She denies drug use ( other tna pot) and / or alcohol use. States she feels " safer now that I am here" and says she is agreeable to taking antidepressants..Admission is completed by this wirter, pt is oriented to the unit and then taken to her room.

## 2018-04-24 NOTE — [facility] Suicide Risk Assessment (Signed)
[facility]   Nursing information obtained from:    Demographic factors:    Current Mental Status:    Loss Factors:    Historical Factors:    Risk Reduction Factors:     Total Time spent with patient: 20 minutes Principal Problem: <principal problem not specified> Diagnosis:   Patient Active Problem List   Diagnosis Date Noted  . PCOS (polycystic ovarian syndrome) [E28.2] 07/22/2017  . Abnormal Pap smear of cervix [R87.619] 07/09/2017  . Depression with anxiety [F41.8] 06/27/2017  . Secondary amenorrhea [N91.1] 06/26/2017   Subjective Data: Patient is seen and examined.  Patient is a 27 year old female who presented [name] department with suicidal ideation.  The patient has a multiyear history of depression going back to adolescence, but has not really been in formal treatment.  She stated that she was very much stressed out over recent events.  The father of her best friend died 2 weeks ago, and unfortunately he died in the driveway of the patient's best friend's home.  Because of the circumstances of his death she witnessed him dead lying in the driveway.  That brought back a great deal of motion about the death of her father from congestive heart failure 2 years ago.  Since then she began to have helplessness, hopelessness and worthlessness.  She began to have thoughts of self-harm.  She stated that she first saw someone about depression and anxiety during adolescence.  Her primary care provider gave her an unspecified medication.  She took it for several months but it was not beneficial.  She also receives Celexa from her OB/GYN doctor, and she stated that that made her feel even more suicidal.  She denied any history of burning or cutting.  She stated she had thought about cutting herself with razors after the witnessed event previously stated.  She stated she finds herself being very anxious, and is unable to relax at all.  She feels like she  is under stress at work as well as the every day stress of life.  She and her partner live together, but the relationship is having some difficulties as well.  She admitted to only sleeping approximately 4 hours a night.  She admitted to anhedonia, helplessness, hopelessness and worthlessness.  Her drug screen was positive for marijuana.  She was admitted to the hospital for evaluation and stabilization.  Continued Clinical Symptoms:    The "Alcohol Use Disorders Identification Test", Guidelines for Use in Primary Care, Second Edition.  [name]. Score between 0-7:  no or low risk or alcohol related problems. Score between 8-15:  moderate risk of alcohol related problems. Score between 16-19:  high risk of alcohol related problems. Score 20 or above:  warrants further diagnostic evaluation for alcohol dependence and treatment.   CLINICAL FACTORS:   Depression:   Anhedonia Comorbid alcohol abuse/dependence Hopelessness Impulsivity Insomnia Alcohol/Substance Abuse/Dependencies   Musculoskeletal: Strength & Muscle Tone: within normal limits Gait & Station: normal Patient leans: N/A  Psychiatric Specialty Exam: Physical Exam  Nursing note and vitals reviewed. Constitutional: She is oriented to person, place, and time. She appears well-developed and well-nourished.  HENT:  Head: Normocephalic and atraumatic.  Respiratory: Effort normal.  Neurological: She is alert and oriented to person, place, and time.    ROS  There were no vitals taken for this visit.There is no height or weight on file to calculate BMI.  General Appearance: Disheveled  Eye Contact:  Fair  Speech:  Slow  Volume:  Decreased  Mood:  Anxious and Depressed  Affect:  Congruent  Thought Process:  Coherent and Descriptions of Associations: Intact  Orientation:  Full (Time, Place, and Person)  Thought Content:  Logical  Suicidal Thoughts:  Yes.  without intent/plan  Homicidal Thoughts:  No   Memory:  Immediate;   Fair Recent;   Fair Remote;   Fair  Judgement:  Impaired  Insight:  Fair  Psychomotor Activity:  Increased  Concentration:  Concentration: Fair and Attention Span: Fair  Recall:  Fis[employer]Fair  Fund of Knowledge:  Fair  Language:  Good  Akathisia:  Negative  Handed:  Right  AIMS (if indicated):     Assets:  Communication Skills Desire for Improvement Financial Resources/Insurance Housing Intimacy Physical Health Resilience Social Support  ADL's:  Intact  Cognition:  WNL  Sleep:         COGNITIVE FEATURES THAT CONTRIBUTE TO RISK:  None    SUICIDE RISK:   Mild:  Suicidal ideation of limited frequency, intensity, duration, and specificity.  There are no identifiable plans, no associated intent, mild dysphoria and related symptoms, good self-control (both objective and subjective assessment), few other risk factors, and identifiable protective factors, including available and accessible social support.  PLAN OF CARE: Patient is seen and examined.  Patient is a 27 year old female with the above-stated past psychiatric history is admitted with suicidal ideation.  She has been previously treated with Celexa, but that did not go well.  She has a family history of depression in her mother, father as well as her sister.  She believes her sister takes Effexor XR.  She will contact her mother also to find out what medications they take just in case she does not tolerate Effexor.  She will be admitted to the hospital.  She will be encouraged to attend groups.  She will be seen by social work.  We will start Effexor XR 37.5 mg p.o. daily starting today.  This will be titrated during the course of hospitalization.  She will also have available hydroxyzine 25 mg p.o. every 6 hours as needed anxiety.  She will also have trazodone 50 mg p.o. nightly as needed available.  Her laboratories were all essentially normal.  Hopefully these medications will be beneficial to her.  I certify  that inpatient services furnished can reasonably be expected to improve the patient's condition.   Antonieta PertGreg Lawson Keerthi Hazell, MD 04/24/2018, 1:05 PM

## 2018-04-24 NOTE — ED Provider Notes (Signed)
[facility] Provider Note   CSN: [id number] Arrival date & time: 04/23/18  2352     History   Chief Complaint Chief Complaint  Patient presents with  . Suicidal    HPI Carolyn Bonilla is a 27 y.o. female.  HPI 27 year old female past medical history significant for PCOS presents to the emergency department today for evaluation of suicidal ideations.  Patient reports increased stress and depression.  She reports having thoughts of hurting herself.  She states she has razor blades in her house and thought about cutting herself but has not done so.  Reports prior history of suicide ideations but denies any prior attempts.  She states that she is tired of feeling this way and wants help.  Patient reports history of seeing a therapist but denies any take any medications.  Denies any drug use or alcohol use.  Does report daily tobacco use.  Denies homicidal ideations or auditory visual hallucinations.  Denies any other associated symptoms at this time. No past medical history on file.  Patient Active Problem List   Diagnosis Date Noted  . PCOS (polycystic ovarian syndrome) 07/22/2017  . Abnormal Pap smear of cervix 07/09/2017  . Depression with anxiety 06/27/2017  . Secondary amenorrhea 06/26/2017    No past surgical history on file.   OB History    Gravida  0   Para  0   Term  0   Preterm  0   AB  0   Living  0     SAB  0   TAB  0   Ectopic  0   Multiple  0   Live Births  0            Home Medications    Prior to Admission medications   Medication Sig Start Date End Date Taking? Authorizing Provider  busPIRone (BUSPAR) 10 MG tablet Take 0.5 tablets (5 mg total) by mouth 3 (three) times daily. 11/17/17   Cheral MarkerBooker, Kimberly R, CNM  citalopram (CELEXA) 20 MG tablet 10mg  (1/2 tab) daily x 7days, then 20mg  (1 tab) daily thereafter Patient not taking: Reported on 10/09/2017 06/26/17   Cheral MarkerBooker, Kimberly R, CNM    desogestrel-ethinyl estradiol (APRI,EMOQUETTE,SOLIA) 0.15-30 MG-MCG tablet Take 1 tablet by mouth daily. 08/14/17   Cheral MarkerBooker, Kimberly R, CNM  medroxyPROGESTERone (PROVERA) 10 MG tablet Take 1 tablet (10 mg total) daily by mouth. X 14days Patient not taking: Reported on 10/09/2017 07/22/17   Cheral MarkerBooker, Kimberly R, CNM    Family History Family History  Problem Relation Age of Onset  . Diabetes Paternal Grandmother   . Hypertension Paternal Grandmother   . Heart failure Father     Social History Social History   Tobacco Use  . Smoking status: Current Every Day Smoker    Packs/day: 0.50    Years: 5.00    Pack years: 2.50    Types: Cigarettes  . Smokeless tobacco: Never Used  Substance Use Topics  . Alcohol use: Yes  . Drug use: No     Allergies   Patient has no known allergies.   Review of Systems Review of Systems  All other systems reviewed and are negative.    Physical Exam Updated Vital Signs BP (!) 168/106 (BP Location: Right Arm)   Pulse 95   Temp 99.5 F (37.5 C) (Oral)   Resp 16   Ht 6' (1.829 m)   Wt 136.1 kg   SpO2 100%   BMI 40.69 kg/m  Physical Exam  Constitutional: She appears well-developed and well-nourished. No distress.  HENT:  Head: Normocephalic and atraumatic.  Eyes: Right eye exhibits no discharge. Left eye exhibits no discharge. No scleral icterus.  Neck: Normal range of motion.  Pulmonary/Chest: No respiratory distress.  Musculoskeletal: Normal range of motion.  Neurological: She is alert.  Skin: No pallor.  Psychiatric: Her behavior is normal. Judgment and thought content normal.  Nursing note and vitals reviewed.    ED Treatments / Results  Labs (all labs ordered are listed, but only abnormal results are displayed) Labs Reviewed  COMPREHENSIVE METABOLIC PANEL - Abnormal; Notable for the following components:      Result Value   Glucose, Bld 103 (*)    Albumin 3.4 (*)    All other components within normal limits   ACETAMINOPHEN LEVEL - Abnormal; Notable for the following components:   Acetaminophen (Tylenol), Serum <10 (*)    All other components within normal limits  RAPID URINE DRUG SCREEN, HOSP PERFORMED - Abnormal; Notable for the following components:   Tetrahydrocannabinol POSITIVE (*)    All other components within normal limits  ETHANOL  SALICYLATE LEVEL  CBC  I-STAT BETA HCG BLOOD, ED ([facility], [facility], [facility] ONLY)    EKG None  Radiology No results found.  Procedures Procedures (including critical care time)  Medications Ordered in ED Medications - No data to display   Initial Impression / Assessment and Plan / ED Course  I have reviewed the triage vital signs and the nursing notes.  Pertinent labs & imaging results that were available during my care of the patient were reviewed by me and considered in my medical decision making (see chart for details).     Presents to the ED for suicidal ideations.  Denies medical complaints.  Patient is hypertensive with no history of same.  Encourage patient to have this followed with primary care doctor.  Medical screening lab work reassuring.  UDS positive for marijuana.  Patient medically cleared for TTS evaluation and disposition.  Final Clinical Impressions(s) / ED Diagnoses   Final diagnoses:  Suicidal ideation    ED Discharge Orders    None       Wallace KellerLeaphart, Kenneth T, PA-C 04/24/18 0236    Palumbo, April, MD 04/24/18 [id number]

## 2018-04-24 NOTE — ED Notes (Addendum)
Pt was changed, belongings inventoried, and wanded by security.  Phone and purse went home with partner

## 2018-04-24 NOTE — ED Notes (Addendum)
Please notify partner if need be. Cristy FolksAlex Hernandez [phone]. Irving Burtonmily gave permission to this nurse it was ok to contact her.

## 2018-04-24 NOTE — BH Assessment (Addendum)
Tele Assessment Note   Patient Name: Carolyn Bonilla MRN: [id number] Referring Physician: Lovena LeLeophart [facility] Location of Provider: Behavioral Health TTS Department  Carolyn Bonilla is an 27 y.o. female.  The pt came in due to having suicidal thoughts.  The pt has a plan to cut herself with razors.  The pt has razors.  She denied having any major stressors other than her regular stressors, such as work and finances.  The pt stated her father died 2 years ago and she thinks she was triggered by her friend's father dying last week.  The pt denies ever having a suicide attempt.  She is currently not seeing a counselor or psychiatrist.  She reported she was seeing a counselor 3 years ago.  She was also taking medication prescribed by her family doctor.  She is currently not on any medication.  The pt lives with her partner and she stated everything is going well at home.  The pt denies any history of self harm.  She denies any family history of suicide attempts, however, she reported both of her parents have depression. The pt denies having access to a gun, [place], legal issues, history of abuse and hallucinations.  The pt reported she sleeps about 4 hours a night and her appetite is up and down.  She reported she often feels hopeless, has crying spells, and has little interest in pleasurable things.  The pt has been using marijuana daily.  Her UDS is negative for all substances except mariajuana.  Pt is dressed in scrubs. She is alert and oriented x4. Pt speaks in a clear tone, at moderate volume and normal pace. Eye contact is good. Pt's mood is depressed. Thought process is coherent and relevant. There is no indication Pt is currently responding to internal stimuli or experiencing delusional thought content.?Pt was cooperative throughout assessment.   Diagnosis:  F33.2 Major depressive disorder, Recurrent episode, Severe F12.20 Cannabis use disorder, Moderate  Past Medical History:  No past medical history on file.  No past surgical history on file.  Family History:  Family History  Problem Relation Age of Onset  . Diabetes Paternal Grandmother   . Hypertension Paternal Grandmother   . Heart failure Father     Social History:  reports that she has been smoking cigarettes. She has a 2.50 pack-year smoking history. She has never used smokeless tobacco. She reports that she drinks alcohol. She reports that she does not use drugs.  Additional Social History:  Alcohol / Drug Use Pain Medications: See MAR Prescriptions: See MAR Over the Counter: See MAR History of alcohol / drug use?: Yes Longest period of sobriety (when/how long): NA Substance #1 Name of Substance 1: Marijuana 1 - Age of First Use: 18 1 - Amount (size/oz): "a couple of puffs" 1 - Frequency: Daily 1 - Duration: 8 1 - Last Use / Amount: 04/23/18  CIWA: CIWA-Ar BP: (!) 168/106 Pulse Rate: 95 COWS:    Allergies: No Known Allergies  Home Medications:  (Not in a hospital admission)  OB/GYN Status:  No LMP recorded. (Menstrual status: Other).  General Assessment Data [facility] Assessment: In system Is this a Tele or Face-to-Face Assessment?: Tele Assessment Is this an Initial Assessment or a Re-assessment for this encounter?: Initial Assessment Marital status: [place]orced [place] Augustine Radar[place]by Is patient pregnant?: No Pregnancy Status: No Living Arrangements: Spouse/significant other Can pt return to current living arrangement?: Yes Admission Status: Voluntary Is patient capable of  signing voluntary admission?: Yes Referral Source: Self/Family/Friend Insurance type: BCBS     Crisis Care Plan Living Arrangements: Spouse/significant other Legal Guardian: Other:(Self) Name of Psychiatrist: none Name of Therapist: none  Education Status Is patient currently in school?: No Is the patient employed, unemployed or receiving disability?: Employed  Risk to self  with the past 6 months Suicidal Ideation: Yes-Currently Present Has patient been a risk to self within the past 6 months prior to admission? : Yes Suicidal Intent: Yes-Currently Present Has patient had any suicidal intent within the past 6 months prior to admission? : Yes Is patient at risk for suicide?: Yes Suicidal Plan?: Yes-Currently Present Has patient had any suicidal plan within the past 6 months prior to admission? : Yes Specify Current Suicidal Plan: cut self with razors Access to Means: Yes Specify Access to Suicidal Means: has razors at home What has been your use of drugs/alcohol within the last 12 months?: daily marijuana use Previous Attempts/Gestures: No How many times?: 0 Other Self Harm Risks: none Triggers for Past Attempts: None known Intentional Self Injurious Behavior: None Family Suicide History: No Recent stressful life event(s): Loss (Comment)(father died 2 years ago and a good friend's father last week) Persecutory voices/beliefs?: No Depression: Yes Depression Symptoms: Despondent, Insomnia, Tearfulness, Isolating, Loss of interest in usual pleasures, Feeling worthless/self pity Substance abuse history and/or treatment for substance abuse?: Yes Suicide prevention information given to non-admitted patients: Not applicable  Risk to Others within the past 6 months Homicidal Ideation: No Does patient have any lifetime risk of violence toward others beyond the six months prior to admission? : No Thoughts of Harm to Others: No Current Homicidal Intent: No Current Homicidal Plan: No Access to Homicidal Means: No Identified Victim: none History of harm to others?: No Assessment of Violence: None Noted Violent Behavior Description: none Does patient have access to weapons?: No Criminal Charges Pending?: No Does patient have a court date: No Is patient on probation?: No  Psychosis Hallucinations: None noted Delusions: None noted  Mental Status  Report Appearance/Hygiene: Unremarkable, In scrubs Eye Contact: Good Motor Activity: Freedom of movement, Unremarkable Speech: Logical/coherent Level of Consciousness: Alert Mood: Depressed Affect: Depressed Anxiety Level: None Thought Processes: Coherent, Relevant Judgement: Impaired Orientation: Person, Place, Time, Situation Obsessive Compulsive Thoughts/Behaviors: None  Cognitive Functioning Concentration: Decreased Memory: Recent Intact, Remote Intact Is patient IDD: No Is patient DD?: No Insight: Poor Impulse Control: Poor Appetite: Fair Have you had any weight changes? : No Change Sleep: Decreased Total Hours of Sleep: 4 Vegetative Symptoms: None  ADLScreening [facility] Assessment Services) Patient's cognitive ability adequate to safely complete daily activities?: Yes Patient able to express need for assistance with ADLs?: Yes Independently performs ADLs?: Yes (appropriate for developmental age)  Prior Inpatient Therapy Prior Inpatient Therapy: No  Prior Outpatient Therapy Prior Outpatient Therapy: Yes Prior Therapy Dates: 2016 Prior Therapy Facilty/Provider(s): pt can't remember Reason for Treatment: depression Does patient have an ACCT team?: No Does patient have Intensive In-House Services?  : No Does patient have [facility] services? : No Does patient have P4CC services?: No  ADL Screening (condition at time of admission) Patient's cognitive ability adequate to safely complete daily activities?: Yes Patient able to express need for assistance with ADLs?: Yes Independently performs ADLs?: Yes (appropriate for developmental age)       Abuse/Neglect Assessment (Assessment to be complete while patient is alone) Abuse/Neglect Assessment Can Be Completed: Yes Physical Abuse: Denies Verbal Abuse: Denies Sexual Abuse: Denies Exploitation of patient/patient's resources: Denies Self-Neglect:  Denies Values / Beliefs Cultural Requests During Hospitalization:  None Spiritual Requests During Hospitalization: None Consults Spiritual Care Consult Needed: No Social Work Consult Needed: No            Disposition:  Disposition Initial Assessment Completed for this Encounter: Yes   NP Nira ConnJason Berry recommends in patient treatment.  [name].  The pt can arrive after 0900.  RN Gabriel RungMonique was made aware.  The RN stated she will inform the provider.  This service was provided via telemedicine using a 2-way, interactive audio and video technology.  Names of all persons participating in this telemedicine service and their role in this encounter. Name: Eilleen Kempfmily Scotti Role: Pt  Name: Riley ChurchesKendall Flem Bonilla Role: TTS  Name:  Role:   Name:  Role:     Ottis StainGarvin, Rafi Kenneth Jermaine 04/24/2018 3:21 AM

## 2018-04-24 NOTE — Tx Team (Signed)
Initial Treatment Plan 04/24/2018 6:24 PM Carolyn Bonilla [place] WUJ:[id number]    PATIENT STRESSORS: Educational concerns Financial difficulties   PATIENT STRENGTHS: Ability for insight Active sense of humor   PATIENT IDENTIFIED PROBLEMS: MDD  Suicidal Ideation            " Im tired of feeling bad"  " I can't go on like this"     DISCHARGE CRITERIA:  Ability to meet basic life and health needs Adequate post-discharge living arrangements Improved stabilization in mood, thinking, and/or behavior  PRELIMINARY DISCHARGE PLAN: Attend aftercare/continuing care group Attend PHP/IOP  PATIENT/FAMILY INVOLVEMENT: This treatment plan has been presented to and reviewed with the patient, Carolyn Bonilla Matney, and/or family member,   The patient and family have been given the opportunity to ask questions and make suggestions.  Rich Braveuke, Kathee Tumlin Lynn, RN 04/24/2018, 6:24 PM

## 2018-04-24 NOTE — Progress Notes (Signed)
Adult Psychoeducational Group Note  Date:  04/24/2018 Time:  10:40 PM  Group Topic/Focus:  Wrap-Up Group:   The focus of this group is to help patients review their daily goal of treatment and discuss progress on daily workbooks.  Participation Level:  Active  Participation Quality:  Appropriate  Affect:  Appropriate  Cognitive:  Appropriate  Insight: Appropriate  Engagement in Group:  Engaged  Modes of Intervention:  Discussion  Additional Comments:  Pt stated she is nervous and jittery about being here and not knowing what to expect.  Pt rated the day at a 4/10.  Aki Burdin 04/24/2018, 10:40 PM

## 2018-04-24 NOTE — ED Triage Notes (Addendum)
Per pt she has been having thoughts of hurting herself. Stated she is tired of feeling this away. She stated she does not have a plan. Pt is very emotional and tearful. Partner stated she has found sharp object in there home and does not know where they have come from.

## 2018-04-24 NOTE — ED Notes (Signed)
Patient ambulatory to bathroom with steady gait at this time 

## 2018-04-24 NOTE — H&P (Signed)
Psychiatric Admission Assessment Adult  Patient Identification: Carolyn Bonilla MRN:  [id number] Date of Evaluation:  04/24/2018 Chief Complaint:  MDD Principal Diagnosis: <principal problem not specified> Diagnosis:   Patient Active Problem List   Diagnosis Date Noted  . Major depression, recurrent ([place]) [F33.9] 04/24/2018  . PCOS (polycystic ovarian syndrome) [E28.2] 07/22/2017  . Abnormal Pap smear of cervix [R87.619] 07/09/2017  . Depression with anxiety [F41.8] 06/27/2017  . 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  Patient is a 27 year old female with who presented to the [facility] emergency department today with suicidal ideation.  The patient has a multiyear history of depression going back to adolescence, but is not really sought formal treatment.  She stated she is been very much stressed out over the recent events.  The father of her best friend died 2 weeks ago from an acute heart issue.  Unfortunately he died in the driveway of the patient's best friend's home.  Because of the circumstances of his death she witnessed him lying dead in the driveway.  This disturbed her.  It brought back a good deal of emotion about the death of her father from congestive heart failure 2 years ago.  Since the death of her friends father she is began to feel even more helpless, hopeless and worthless.  She has had anhedonia.  She is been having thoughts of self-harm.  She stated that she first saw on about depression and anxiety during adolescence.  Her primary care provider gave her unspecified medication which she took for several months, but was not beneficial.  She did also receive Celexa from her OB GYN and she stated that the Celexa made her feel even more suicidal.  She is also been in therapy, but she did not find that form of therapy beneficial.  She denied any history of burning or cutting behaviors.  She stated she had thought about  cutting herself with razors after the above witnessed event.  She stated she also finds herself being very anxious and unable to relax at all.  She feels like she is under a great deal of stress at work as well as every day stresses of life.  She and her partner live together, but the relationship is having some difficulties as well.  She admitted only sleeping approximately 4 hours a night.  She admitted to anhedonia, helplessness, hopelessness and worthlessness.  Her drug screen was positive for marijuana.  She said the marijuana helps her with her anxiety.  She was admitted to the hospital for evaluation and stabilization. Associated Signs/Symptoms: Depression Symptoms:  depressed mood, anhedonia, insomnia, psychomotor agitation, fatigue, feelings of worthlessness/guilt, difficulty concentrating, hopelessness, suicidal thoughts without plan, anxiety, panic attacks, loss of energy/fatigue, disturbed sleep, (Hypo) Manic Symptoms:  Distractibility, Impulsivity, Anxiety Symptoms:  Excessive Worry, Psychotic Symptoms:  Denied PTSD Symptoms: Negative Total Time spent with patient: 30 minutes  Past Psychiatric History: Patient has been treated with 2 medications in the past.  She received Celexa from her OB/GYN.  This made her more suicidal.  She received an unspecified medication as an adolescent, and that was not effective.  She has been in therapy.  This is her first psychiatric admission.  Is the patient at risk to self? Yes.    Has the patient been a risk to self in the past 6 months? No.  Has the patient been a risk to self within the distant past? No.  Is the patient a  risk to others? No.  Has the patient been a risk to others in the past 6 months? No.  Has the patient been a risk to others within the distant past? No.   Prior Inpatient Therapy:   Prior Outpatient Therapy:    Alcohol Screening:   Substance Abuse History in the last 12 months:  Yes.   Consequences of Substance  Abuse: Negative Previous Psychotropic Medications: Yes  Psychological Evaluations: No  Past Medical History: No past medical history on file. No past surgical history on file. Family History:  Family History  Problem Relation Age of Onset  . Diabetes Paternal Grandmother   . Hypertension Paternal Grandmother   . Heart failure Father    Family Psychiatric  History: Patient stated that both her mother and father had issues with depression she stated her sister also has depression, and she takes Effexor. Tobacco Screening:   Social History:  Social History   Substance and Sexual Activity  Alcohol Use Yes     Social History   Substance and Sexual Activity  Drug Use No    Additional Social History:                           Allergies:  No Known Allergies Lab Results:  Results for orders placed or performed during the hospital encounter of 04/23/18 (from the past 48 hour(s))  Rapid urine drug screen (hospital performed)     Status: Abnormal   Collection Time: 04/24/18 12:34 AM  Result Value Ref Range   Opiates NONE DETECTED NONE DETECTED   Cocaine NONE DETECTED NONE DETECTED   Benzodiazepines NONE DETECTED NONE DETECTED   Amphetamines NONE DETECTED NONE DETECTED   Tetrahydrocannabinol POSITIVE (A) NONE DETECTED   Barbiturates NONE DETECTED NONE DETECTED    Comment: (NOTE) DRUG SCREEN FOR MEDICAL PURPOSES ONLY.  IF CONFIRMATION IS NEEDED FOR ANY PURPOSE, NOTIFY LAB WITHIN 5 DAYS. LOWEST DETECTABLE LIMITS FOR URINE DRUG SCREEN Drug Class                     Cutoff (ng/mL) Amphetamine and metabolites    1000 Barbiturate and metabolites    200 Benzodiazepine                 614 Tricyclics and metabolites     300 Opiates and metabolites        300 Cocaine and metabolites        300 THC                            50 Performed at [facility], [place] [address] [place], [place] [address]   Comprehensive metabolic panel     Status: Abnormal   Collection  Time: 04/24/18 12:41 AM  Result Value Ref Range   Sodium 142 135 - 145 mmol/L   Potassium 4.2 3.5 - 5.1 mmol/L   Chloride 110 98 - 111 mmol/L   CO2 23 22 - 32 mmol/L   Glucose, Bld 103 (H) 70 - 99 mg/dL   BUN 9 6 - 20 mg/dL   Creatinine, Ser 0.80 0.44 - 1.00 mg/dL   Calcium 9.3 8.9 - 10.3 mg/dL   Total Protein 7.0 6.5 - 8.1 g/dL   Albumin 3.4 (L) 3.5 - 5.0 g/dL   AST 23 15 - 41 U/L   ALT 29 0 - 44 U/L   Alkaline Phosphatase 50 38 -  126 U/L   Total Bilirubin 0.4 0.3 - 1.2 mg/dL   GFR calc non Af Amer >60 >60 mL/min   GFR calc Af Amer >60 >60 mL/min    Comment: (NOTE) The eGFR has been calculated using the CKD EPI equation. This calculation has not been validated in all clinical situations. eGFR's persistently <60 mL/min signify possible Chronic Kidney Disease.    Anion gap 9 5 - 15    Comment: Performed at [facility] [address] [place], [place] [address]  Ethanol     Status: None   Collection Time: 04/24/18 12:41 AM  Result Value Ref Range   Alcohol, Ethyl (B) <10 <10 mg/dL    Comment: (NOTE) Lowest detectable limit for serum alcohol is 10 mg/dL. For medical purposes only. Performed at [facility], [place] [address] [place], [place] [address]   Salicylate level     Status: None   Collection Time: 04/24/18 12:41 AM  Result Value Ref Range   Salicylate Lvl <2.9 2.8 - 30.0 mg/dL    Comment: Performed at [facility] [address] [place], [place] [address]  Acetaminophen level     Status: Abnormal   Collection Time: 04/24/18 12:41 AM  Result Value Ref Range   Acetaminophen (Tylenol), Serum <10 (L) 10 - 30 ug/mL    Comment: (NOTE) Therapeutic concentrations vary significantly. A range of 10-30 ug/mL  may be an effective concentration for many patients. However, some  are best treated at concentrations outside of this range. Acetaminophen concentrations >150 ug/mL at 4 hours after ingestion  and >50 ug/mL at 12 hours after ingestion are often  associated with  toxic reactions. Performed at [facility], [place] [address] [place], [place] [address]   cbc     Status: None   Collection Time: 04/24/18 12:41 AM  Result Value Ref Range   WBC 9.5 4.0 - 10.5 K/uL   RBC 4.73 3.87 - 5.11 MIL/uL   Hemoglobin 12.9 12.0 - 15.0 g/dL   HCT 40.8 36.0 - 46.0 %   MCV 86.3 78.0 - 100.0 fL   MCH 27.3 26.0 - 34.0 pg   MCHC 31.6 30.0 - 36.0 g/dL   RDW 13.5 11.5 - 15.5 %   Platelets 325 150 - 400 K/uL    Comment: Performed at [facility], [place] [address], [place], [place] [address]  I-Stat beta hCG blood, ED     Status: None   Collection Time: 04/24/18 12:55 AM  Result Value Ref Range   I-stat hCG, quantitative <5.0 <5 mIU/mL   Comment 3            Comment:   GEST. AGE      CONC.  (mIU/mL)   <=1 WEEK        5 - 50     2 WEEKS       50 - 500     3 WEEKS       100 - 10,000     4 WEEKS     1,000 - 30,000        FEMALE AND NON-PREGNANT FEMALE:     LESS THAN 5 mIU/mL     Blood Alcohol level:  Lab Results  Component Value Date   ETH <10 29/52/8413    Metabolic Disorder Labs:  Lab Results  Component Value Date   HGBA1C 5.1 06/26/2017   Lab Results  Component Value Date   PROLACTIN 17.5 07/22/2017  No results found for: CHOL, TRIG, HDL, CHOLHDL, VLDL, LDLCALC  Current Medications: Current Facility-Administered Medications  Medication Dose Route Frequency Provider Last Rate Last Dose  . acetaminophen (TYLENOL) tablet 650 mg  650 mg Oral Q6H PRN Sharma Covert, MD      . alum & mag hydroxide-simeth (MAALOX/MYLANTA) 200-200-20 MG/5ML suspension 30 mL  30 mL Oral Q4H PRN Sharma Covert, MD      . hydrOXYzine (ATARAX/VISTARIL) tablet 25 mg  25 mg Oral Q6H PRN Sharma Covert, MD      . magnesium hydroxide (MILK OF MAGNESIA) suspension 30 mL  30 mL Oral Daily PRN Sharma Covert, MD      . traZODone (DESYREL) tablet 50 mg  50 mg Oral QHS PRN Sharma Covert, MD      . venlafaxine XR (EFFEXOR-XR) 24 hr  capsule 37.5 mg  37.5 mg Oral Q breakfast Sharma Covert, MD       PTA Medications: Medications Prior to Admission  Medication Sig Dispense Refill Last Dose  . busPIRone (BUSPAR) 10 MG tablet Take 0.5 tablets (5 mg total) by mouth 3 (three) times daily. (Patient not taking: Reported on 04/24/2018) 45 tablet 3 Not Taking at Unknown time  . citalopram (CELEXA) 20 MG tablet 39m (1/2 tab) daily x 7days, then 210m(1 tab) daily thereafter (Patient not taking: Reported on 10/09/2017) 30 tablet 6 Not Taking at Unknown time  . desogestrel-ethinyl estradiol (APRI,EMOQUETTE,SOLIA) 0.15-30 MG-MCG tablet Take 1 tablet by mouth daily. 3 Package 3 04/20/2018  . medroxyPROGESTERone (PROVERA) 10 MG tablet Take 1 tablet (10 mg total) daily by mouth. X 14days (Patient not taking: Reported on 10/09/2017) 14 tablet 0 Not Taking at Unknown time    Musculoskeletal: Strength & Muscle Tone: within normal limits Gait & Station: normal Patient leans: N/A  Psychiatric Specialty Exam: Physical Exam  Nursing note and vitals reviewed. Constitutional: She is oriented to person, place, and time. She appears well-developed and well-nourished.  HENT:  Head: Normocephalic and atraumatic.  Respiratory: Effort normal.  Neurological: She is alert and oriented to person, place, and time.    ROS  There were no vitals taken for this visit.There is no height or weight on file to calculate BMI.  General Appearance: Disheveled  Eye Contact:  Fair  Speech:  Normal Rate  Volume:  Normal  Mood:  Anxious and Depressed  Affect:  Congruent  Thought Process:  Coherent and Descriptions of Associations: Intact  Orientation:  Full (Time, Place, and Person)  Thought Content:  Logical  Suicidal Thoughts:  Yes.  without intent/plan  Homicidal Thoughts:  No  Memory:  Immediate;   Good Recent;   Good Remote;   Good  Judgement:  Intact  Insight:  Fair  Psychomotor Activity:  Increased  Concentration:  Concentration: Fair and  Attention Span: Fair  Recall:  [employer] Knowledge:  Good  Language:  Good  Akathisia:  Negative  Handed:  Right  AIMS (if indicated):     Assets:  Communication Skills Desire for Improvement Financial Resources/Insurance Housing Intimacy Physical Health Resilience Social Support  ADL's:  Intact  Cognition:  WNL  Sleep:       Treatment Plan Summary: Daily contact with patient to assess and evaluate symptoms and progress in treatment, Medication management and Plan : Patient is seen and examined.  Patient is a [age]ear old female with a probable past psychiatric history significant for major depression who was admitted with suicidal ideation.  She will  be admitted to the unit.  She will be introduced into the milieu.  She will be encouraged to attend groups.  She will meet with social work.  She will be started on Effexor XR 37.5 mg p.o. daily.  She will also have available hydroxyzine and trazodone.  She will be placed on 15-minute checks.  We will check her thyroid.  Observation Level/Precautions:  15 minute checks  Laboratory:  Chemistry Profile  Psychotherapy:    Medications:    Consultations:    Discharge Concerns:    Estimated LOS:  Other:     Physician Treatment Plan for Primary Diagnosis: <principal problem not specified> Long Term Goal(s): Improvement in symptoms so as ready for discharge  Short Term Goals: Ability to identify changes in lifestyle to reduce recurrence of condition will improve, Ability to verbalize feelings will improve, Ability to disclose and discuss suicidal ideas, Ability to demonstrate self-control will improve, Ability to identify and develop effective coping behaviors will improve and Ability to maintain clinical measurements within normal limits will improve  Physician Treatment Plan for Secondary Diagnosis: Active Problems:   Major depression, recurrent ([place])  Long Term Goal(s): Improvement in symptoms so as ready for discharge  Short  Term Goals: Ability to identify changes in lifestyle to reduce recurrence of condition will improve, Ability to verbalize feelings will improve, Ability to disclose and discuss suicidal ideas, Ability to demonstrate self-control will improve, Ability to identify and develop effective coping behaviors will improve and Ability to maintain clinical measurements within normal limits will improve  I certify that inpatient services furnished can reasonably be expected to improve the patient's condition.    Sharma Covert, MD 8/16/20191:17 PM

## 2018-04-25 LAB — LIPID PANEL
CHOL/HDL RATIO: 4.2 ratio
CHOLESTEROL: 225 mg/dL — AB (ref 0–200)
HDL: 54 mg/dL (ref >40)
LDL Cholesterol: 146 mg/dL — ABNORMAL HIGH (ref 0–99)
Triglycerides: 123 mg/dL (ref <150)
VLDL: 25 mg/dL (ref 0–40)

## 2018-04-25 LAB — TSH: TSH: 3.175 u[IU]/mL (ref 0.350–4.500)

## 2018-04-25 LAB — HEMOGLOBIN A1C
Hgb A1c MFr Bld: 5.2 % (ref 4.8–5.6)
MEAN PLASMA GLUCOSE: 102.54 mg/dL

## 2018-04-25 MED ORDER — VENLAFAXINE HCL ER 75 MG PO CP24
75.0000 mg | ORAL_CAPSULE | Freq: Every day | ORAL | Status: DC
  Administered 2018-04-26 – 2018-04-27 (×2): 75 mg via ORAL
  Filled 2018-04-25 (×3): qty 1

## 2018-04-25 NOTE — [facility] Group Notes (Signed)
Adult Psychoeducational Group Note  Date:  04/25/2018 Time:  10:10 PM  Group Topic/Focus:  Wrap-Up Group:   The focus of this group is to help patients review their daily goal of treatment and discuss progress on daily workbooks.  Participation Level:  Active  Participation Quality:  Appropriate and Attentive  Affect:  Appropriate  Cognitive:  Alert and Appropriate  Insight: Appropriate and Good  Engagement in Group:  Engaged  Modes of Intervention:  Discussion and Education  Additional Comments:  Pt attended and participated in wrap up group this evening. Pt had a good day, due to them talking and not feeling as anxious. Pt goal was to not focus on negativity. Group helped them with finding triggers for their anger.   Chrisandra NettersOctavia A Hayle Parisi 04/25/2018, 10:10 PM

## 2018-04-25 NOTE — Progress Notes (Signed)
[facility] MD Progress Note  04/25/2018 10:08 AM Carolyn Bonilla  MRN:  [id number] Subjective: Patient is seen and examined.  Patient is a 27 year old female with a past psychiatric history significant for major depression; recurrent, severe without psychotic features as well as generalized anxiety disorder.  She states she is feeling better today.  She states she slept well last night with the trazodone.  She stated her suicidal thoughts a decrease.  She did get a mild headache to the Effexor, and we discussed letting it stay at 37.5 mg p.o. daily today, and increasing it up to 75 if she tolerates it.  Overall she stated she felt as though she was 30% better. Principal Problem: <principal problem not specified> Diagnosis:   Patient Active Problem List   Diagnosis Date Noted  . Major depression, recurrent (HCC) [F33.9] 04/24/2018  . Severe recurrent major depression without psychotic features (HCC) [F33.2] 04/24/2018  . Generalized anxiety disorder [F41.1]   . PCOS (polycystic ovarian syndrome) [E28.2] 07/22/2017  . Abnormal Pap smear of cervix [R87.619] 07/09/2017  . Depression with anxiety [F41.8] 06/27/2017  . Secondary amenorrhea [N91.1] 06/26/2017   Total Time spent with patient: 15 minutes  Past Psychiatric History: See admission H&P  Past Medical History: History reviewed. No pertinent past medical history. History reviewed. No pertinent surgical history. Family History:  Family History  Problem Relation Age of Onset  . Diabetes Paternal Grandmother   . Hypertension Paternal Grandmother   . Heart failure Father    Family Psychiatric  History: See admission H&P Social History:  Social History   Substance and Sexual Activity  Alcohol Use Never  . Frequency: Never     Social History   Substance and Sexual Activity  Drug Use Yes  . Types: Marijuana    Social History   Socioeconomic History  . Marital status: Married    Spouse name: Not on file  . Number of children: Not  on file  . Years of education: Not on file  . Highest education level: Not on file  Occupational History  . Not on file  Social Needs  . Financial resource strain: Very hard  . Food insecurity:    Worry: Never true    Inability: Often true  . Transportation needs:    Medical: No    Non-medical: Yes  Tobacco Use  . Smoking status: Current Every Day Smoker    Packs/day: 0.50    Years: 0.00    Pack years: 0.00    Types: Cigarettes  . Smokeless tobacco: Never Used  Substance and Sexual Activity  . Alcohol use: Never    Frequency: Never  . Drug use: Yes    Types: Marijuana  . Sexual activity: Yes    Birth control/protection: None  Lifestyle  . Physical activity:    Days per week: 0 days    Minutes per session: 0 min  . Stress: Very much  Relationships  . Social connections:    Talks on phone: Once a week    Gets together: Never    Attends religious service: Never    Active member of club or organization: Yes    Attends meetings of clubs or organizations: Never    Relationship status: Married  Other Topics Concern  . Not on file  Social History Narrative  . Not on file   Additional Social History:    Pain Medications: See MAR Prescriptions: See MAR Over the Counter: See MAR History of alcohol / drug use?: Yes Longest period  of sobriety (when/how long): NA Negative Consequences of Use: Financial, Legal, Personal relationships Withdrawal Symptoms: Anorexia, Patient aware of relationship between substance abuse and physical/medical complications, Tingling Name of Substance 1: Marijuana 1 - Age of First Use: 18 1 - Amount (size/oz): "a couple of puffs" 1 - Frequency: Daily 1 - Duration: 30 min 1 - Last Use / Amount: 04/23/18                  Sleep: Good  Appetite:  Good  Current Medications: Current Facility-Administered Medications  Medication Dose Route Frequency Provider Last Rate Last Dose  . acetaminophen (TYLENOL) tablet 650 mg  650 mg Oral Q6H  PRN Antonieta Pertlary, Greg Lawson, MD   650 mg at 04/24/18 1537  . alum & mag hydroxide-simeth (MAALOX/MYLANTA) 200-200-20 MG/5ML suspension 30 mL  30 mL Oral Q4H PRN Antonieta Pertlary, Greg Lawson, MD      . hydrOXYzine (ATARAX/VISTARIL) tablet 25 mg  25 mg Oral Q6H PRN Antonieta Pertlary, Greg Lawson, MD   25 mg at 04/24/18 2104  . magnesium hydroxide (MILK OF MAGNESIA) suspension 30 mL  30 mL Oral Daily PRN Antonieta Pertlary, Greg Lawson, MD      . traZODone (DESYREL) tablet 50 mg  50 mg Oral QHS PRN Antonieta Pertlary, Greg Lawson, MD   50 mg at 04/24/18 2104  . venlafaxine XR (EFFEXOR-XR) 24 hr capsule 37.5 mg  37.5 mg Oral Q breakfast Antonieta Pertlary, Greg Lawson, MD   37.5 mg at 04/25/18 [id number]    Lab Results:  Results for orders placed or performed during the hospital encounter of 04/24/18 (from the past 48 hour(s))  Hemoglobin A1c     Status: None   Collection Time: 04/25/18  6:23 AM  Result Value Ref Range   Hgb A1c MFr Bld 5.2 4.8 - 5.6 %    Comment: (NOTE) Pre diabetes:          5.7%-6.4% Diabetes:              >6.4% Glycemic control for   <7.0% adults with diabetes    Mean Plasma Glucose 102.54 mg/dL    Comment: Performed at [facility], [address] [place], [place] [address]  Lipid panel     Status: Abnormal   Collection Time: 04/25/18  6:23 AM  Result Value Ref Range   Cholesterol 225 (H) 0 - 200 mg/dL   Triglycerides 409123 <811<150 mg/dL   HDL 54 >91>40 mg/dL   Total CHOL/HDL Ratio 4.2 RATIO   VLDL 25 0 - 40 mg/dL   LDL Cholesterol 478146 (H) 0 - 99 mg/dL    Comment:        Total Cholesterol/HDL:CHD Risk Coronary Heart Disease Risk Table                     Men   Women  1/2 Average Risk   3.4   3.3  Average Risk       5.0   4.4  2 X Average Risk   9.6   7.1  3 X Average Risk  23.4   11.0        Use the calculated Patient Ratio above and the CHD Risk Table to determine the patient's CHD Risk.        ATP III CLASSIFICATION (LDL):  <100     mg/dL   Optimal  [id number]  mg/dL   Near or Above  Optimal  130-159   mg/dL   Borderline  [id number]  mg/dL   High  >045>190     mg/dL   Very High Performed at [facility], [address] [place], [place] [address]   TSH     Status: None   Collection Time: 04/25/18  6:23 AM  Result Value Ref Range   TSH 3.175 0.350 - 4.500 uIU/mL    Comment: Performed by a 3rd Generation assay with a functional sensitivity of <=0.01 uIU/mL. Performed at [facility], [address] [place], [place] [address]     Blood Alcohol level:  Lab Results  Component Value Date   ETH <10 04/24/2018    Metabolic Disorder Labs: Lab Results  Component Value Date   HGBA1C 5.2 04/25/2018   MPG 102.54 04/25/2018   Lab Results  Component Value Date   PROLACTIN 17.5 07/22/2017   Lab Results  Component Value Date   CHOL 225 (H) 04/25/2018   TRIG 123 04/25/2018   HDL 54 04/25/2018   CHOLHDL 4.2 04/25/2018   VLDL 25 04/25/2018   LDLCALC 146 (H) 04/25/2018    Physical Findings: AIMS: Facial and Oral Movements Muscles of Facial Expression: None, normal Lips and Perioral Area: None, normal Jaw: None, normal Tongue: None, normal,Extremity Movements Upper (arms, wrists, hands, fingers): None, normal Lower (legs, knees, ankles, toes): None, normal, Trunk Movements Neck, shoulders, hips: None, normal, Overall Severity Severity of abnormal movements (highest score from questions above): None, normal Incapacitation due to abnormal movements: None, normal Patient's awareness of abnormal movements (rate only patient's report): No Awareness, Dental Status Current problems with teeth and/or dentures?: No Does patient usually wear dentures?: No  CIWA:  CIWA-Ar Total: 6 COWS:  COWS Total Score: 0  Musculoskeletal: Strength & Muscle Tone: within normal limits Gait & Station: normal Patient leans: N/A  Psychiatric Specialty Exam: Physical Exam  Nursing note and vitals reviewed. Constitutional: She is oriented to person, place, and time. She  appears well-developed and well-nourished.  HENT:  Head: Normocephalic and atraumatic.  Respiratory: Effort normal.  Neurological: She is alert and oriented to person, place, and time.    ROS  Blood pressure (!) 131/100, pulse 86, temperature 98.3 F (36.8 C), temperature source Oral, resp. rate (!) 24, height 5\' 11"  (1.803 m), weight (!) 140.2 kg, SpO2 100 %.Body mass index is 43.1 kg/m.  General Appearance: Casual  Eye Contact:  Fair  Speech:  Normal Rate  Volume:  Decreased  Mood:  Anxious and Depressed  Affect:  Congruent  Thought Process:  Coherent and Descriptions of Associations: Intact  Orientation:  Full (Time, Place, and Person)  Thought Content:  Logical  Suicidal Thoughts:  No  Homicidal Thoughts:  No  Memory:  Immediate;   Fair Recent;   Fair Remote;   Fair  Judgement:  Intact  Insight:  Fair  Psychomotor Activity:  Normal  Concentration:  Concentration: Fair and Attention Span: Fair  Recall:  [employer]Fair  Fund of Knowledge:  Good  Language:  Good  Akathisia:  Negative  Handed:  Right  AIMS (if indicated):     Assets:  Communication Skills Desire for Improvement Financial Resources/Insurance Housing Intimacy Physical Health Resilience Social Support  ADL's:  Intact  Cognition:  WNL  Sleep:  Number of Hours: 6.25     Treatment Plan Summary: Daily contact with patient to assess and evaluate symptoms and progress in treatment, Medication management and Plan : Patient is seen and examined.  Patient is  a 27 year old female with the above-stated past psychiatric history who is seen in follow-up.  She is doing better today.  She is tolerating the Effexor with only a mild headache.  She slept well with the trazodone.  I will increase her Effexor to 75 mg p.o. daily starting tomorrow.  No other changes in her medications.  Antonieta Pert, MD 04/25/2018, 10:08 AM

## 2018-04-25 NOTE — [facility] Group Notes (Signed)
[name] Group Notes:  (Nursing)  Date:  04/25/2018  Time:  1:30 PM Type of Therapy:  Nurse Education  Participation Level:  Active  Participation Quality:  Attentive  Affect:  Appropriate  Cognitive:  Appropriate  Insight:  Appropriate  Engagement in Group:  Engaged  Modes of Intervention:  Discussion and Education  Summary of Progress/Problems: Patient actively engaged in nurse led educational group 'Identifying Needs'  Shela NevinValerie S Makinlee Awwad 04/25/2018, 4:32 PM

## 2018-04-25 NOTE — [facility] Group Notes (Signed)
LCSW Group Therapy Note  04/25/2018    10:30-11:30am   Type of Therapy and Topic:  Group Therapy: Anger and Coping Skills  Participation Level:  Active   Description of Group:   In this group, patients learned how to recognize the physical, cognitive, emotional, and behavioral responses they have to anger-provoking situations.  They identified how they usually or often react when angered, and learned how healthy and unhealthy coping skills work initially, but the unhealthy ones stop working.   They analyzed how their frequently-chosen coping skill is possibly beneficial and how it is possibly unhelpful.  The group discussed a variety of healthier coping skills that could help in resolving the actual issues, as well as how to go about planning for the the possibility of future similar situations.  Therapeutic Goals: 1. Patients will identify one thing that makes them angry and how they feel emotionally and physically, what their thoughts are or tend to be in those situations, and what healthy or unhealthy coping mechanism they typically use 2. Patients will identify how their coping technique works for them, as well as how it works against them. 3. Patients will explore possible new behaviors to use in future anger situations. 4. Patients will learn that anger itself is normal and cannot be eliminated, and that healthier coping skills can assist with resolving conflict rather than worsening situations.  Summary of Patient Progress:  The patient shared that she was "annoyed" a day ago when she first got here to the hospital, and this was because she called someone in her life about getting some clothing, but could not convey her message so she said she would call back later and did so after distancing herself first.  She feels she reacted with a positive coping skill this time.  Therapeutic Modalities:   Cognitive Behavioral Therapy Motivation Interviewing  Lynnell ChadMareida J Grossman-Orr  .

## 2018-04-25 NOTE — Progress Notes (Signed)
Patient ID: Carolyn Bonilla, female   DOB: 04/09/1991, 27 y.o.   MRN: [id number] Pt observed in the dayroom interacting with peers. Pt continued to endorsed moderate anxiety; denies depression at this time; "I feel much better today." Pt also denied pain, SI, HI or AVH. Support, encouragement, and safe environment provided.  15-minute safety checks continue. Pt did attend wrap-up group.

## 2018-04-26 MED ORDER — TRAZODONE HCL 100 MG PO TABS
100.0000 mg | ORAL_TABLET | Freq: Every evening | ORAL | Status: DC | PRN
  Administered 2018-04-26: 100 mg via ORAL
  Filled 2018-04-26: qty 1

## 2018-04-26 NOTE — Progress Notes (Signed)
Nursing Progress Note: 7-7p  D- Mood is depressed and anxious,rates anxiety at 5/10. Affect is blunted and appropriate. Pt is able to contract for safety. Continues to have difficulty staying asleep even with her sleep medication. Pt was encouraged to  discuss with doctor. Goal for today is discharge plan and resources to find a therapist.  A - Observed pt interacting in group and in the milieu.Support and encouragement offered, safety maintained with q 15 minutes.  R-Contracts for safety and continues to follow treatment plan, working on learning new coping skills for anxiety

## 2018-04-26 NOTE — Progress Notes (Signed)
Pt observed in the dayroom interacting with peers. Pt continued to endorsed moderate anxiety; continue to deny depression at this time; "I feel much better today." Pt also denied pain, SI, HI or AVH. Support, encouragement, and safe environment provided.  15-minute safety checks continue. Pt did attend wrap-up group

## 2018-04-26 NOTE — Progress Notes (Signed)
Adult Psychoeducational Group Note  Date:  04/26/2018 Time:  1:00pm  Group Topic/Focus:  Wellness Toolbox:   The focus of this group is to discuss various aspects of wellness, balancing those aspects and exploring ways to increase the ability to experience wellness.  Patients will create a wellness toolbox for use upon discharge.  Participation Level:  Active  Participation Quality:  Appropriate and Attentive  Affect:  Anxious and Appropriate  Cognitive:  Alert and Appropriate  Insight: Appropriate, Good and Improving  Engagement in Group:  Developing/Improving  Modes of Intervention:  Discussion and Education  Additional Comments: Pt was alert and actively partipated  Jimmey Ralpherez, Lainie Daubert M 04/26/2018, 2:36 PM
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
## 2018-04-26 NOTE — [facility] Group Notes (Signed)
[name] LCSW Group Therapy Note  04/26/2018  10:00-11:00AM  Type of Therapy and Topic:  Group Therapy:  Being Your Own Support  Participation Level:  Active   Description of Group:  Patients in this group were introduced to the concept that self-support is an essential part of recovery.  A song entitled "My Own Hero" was played and a group discussion ensued in which patients stated they could relate to the song and it inspired them to realize they have be willing to help themselves in order to succeed, because other people cannot achieve sobriety or stability for them.  We discussed adding a variety of healthy supports to address the various needs in their lives.  A song was played called "I Know Where I've Been" toward the end of group and used to conduct an inspirational wrap-up to group of remembering how far they have already come in their journey.  Therapeutic Goals: 1)  demonstrate the importance of being a part of one's own support system 2)  discuss reasons people in one's life may eventually be unable to be continually supportive  3)  identify the patient's current support system and   4)  elicit commitments to add healthy supports and to become more conscious of being self-supportive   Summary of Patient Progress:  The patient expressed that her best friends are a healthy support for her,while her mother and stepfather are sometimes healthy but sometimes not, caring about her but also telling her to "get over it."  She said she herself is a poor self-support and she knows that she needs to do better in that area.  She would like to add therapy groups to help with her support system.   Therapeutic Modalities:   Motivational Interviewing Activity  Carolyn Bonilla

## 2018-04-26 NOTE — Plan of Care (Signed)
D: Pt denies SI/HI/AVH. Pt is pleasant and cooperative. Pt stated she was feeling better this evening due to being in a safe place, meds, being able to think clearly.  A: Pt was offered support and encouragement. Pt was given scheduled medications. Pt was encourage to attend groups. Q 15 minute checks were done for safety.  R:Pt attends groups and interacts well with peers and staff. Pt is taking medication. Pt has no complaints.Pt receptive to treatment and safety maintained on unit.   Problem: Education: Goal: Emotional status will improve Outcome: Progressing   Problem: Education: Goal: Mental status will improve Outcome: Progressing   Problem: Activity: Goal: Sleeping patterns will improve Outcome: Progressing

## 2018-04-26 NOTE — Progress Notes (Signed)
[facility] MD Progress Note  04/26/2018 10:02 AM Carolyn Bonilla  MRN:  [id number] Subjective: Patient is seen and examined.  Patient is a 27 year old female with a past psychiatric history significant for major depression; recurrent, severe without psychotic features and generalized anxiety disorder.  She is seen in follow-up.  She stated her mood and anxiety are generally doing well.  She states she did not sleep as well last night.  We discussed the possibility of increasing the trazodone.  She did state that her partner has previously told her that she does snore, and she has not had a sleep study in the past.  She denied any side effects to her current medications.  She stated she was still about 30 to 40% better than on admission.  She denied suicidal ideation. Principal Problem: <principal problem not specified> Diagnosis:   Patient Active Problem List   Diagnosis Date Noted  . Major depression, recurrent (HCC) [F33.9] 04/24/2018  . Severe recurrent major depression without psychotic features (HCC) [F33.2] 04/24/2018  . Generalized anxiety disorder [F41.1]   . PCOS (polycystic ovarian syndrome) [E28.2] 07/22/2017  . Abnormal Pap smear of cervix [R87.619] 07/09/2017  . Depression with anxiety [F41.8] 06/27/2017  . Secondary amenorrhea [N91.1] 06/26/2017   Total Time spent with patient: 15 minutes  Past Psychiatric History: See admission H&P  Past Medical History: History reviewed. No pertinent past medical history. History reviewed. No pertinent surgical history. Family History:  Family History  Problem Relation Age of Onset  . Diabetes Paternal Grandmother   . Hypertension Paternal Grandmother   . Heart failure Father    Family Psychiatric  History: See admission H&P Social History:  Social History   Substance and Sexual Activity  Alcohol Use Never  . Frequency: Never     Social History   Substance and Sexual Activity  Drug Use Yes  . Types: Marijuana    Social History    Socioeconomic History  . Marital status: Married    Spouse name: Not on file  . Number of children: Not on file  . Years of education: Not on file  . Highest education level: Not on file  Occupational History  . Not on file  Social Needs  . Financial resource strain: Very hard  . Food insecurity:    Worry: Never true    Inability: Often true  . Transportation needs:    Medical: No    Non-medical: Yes  Tobacco Use  . Smoking status: Current Every Day Smoker    Packs/day: 0.50    Years: 0.00    Pack years: 0.00    Types: Cigarettes  . Smokeless tobacco: Never Used  Substance and Sexual Activity  . Alcohol use: Never    Frequency: Never  . Drug use: Yes    Types: Marijuana  . Sexual activity: Yes    Birth control/protection: None  Lifestyle  . Physical activity:    Days per week: 0 days    Minutes per session: 0 min  . Stress: Very much  Relationships  . Social connections:    Talks on phone: Once a week    Gets together: Never    Attends religious service: Never    Active member of club or organization: Yes    Attends meetings of clubs or organizations: Never    Relationship status: Married  Other Topics Concern  . Not on file  Social History Narrative  . Not on file   Additional Social History:    Pain Medications:  See MAR Prescriptions: See MAR Over the Counter: See MAR History of alcohol / drug use?: Yes Longest period of sobriety (when/how long): NA Negative Consequences of Use: Financial, Legal, Personal relationships Withdrawal Symptoms: Anorexia, Patient aware of relationship between substance abuse and physical/medical complications, Tingling Name of Substance 1: Marijuana 1 - Age of First Use: 18 1 - Amount (size/oz): "a couple of puffs" 1 - Frequency: Daily 1 - Duration: 30 min 1 - Last Use / Amount: 04/23/18                  Sleep: Fair  Appetite:  Good  Current Medications: Current Facility-Administered Medications  Medication  Dose Route Frequency Provider Last Rate Last Dose  . acetaminophen (TYLENOL) tablet 650 mg  650 mg Oral Q6H PRN Antonieta Pertlary, Tehila Sokolow Lawson, MD   650 mg at 04/24/18 1537  . alum & mag hydroxide-simeth (MAALOX/MYLANTA) 200-200-20 MG/5ML suspension 30 mL  30 mL Oral Q4H PRN Antonieta Pertlary, Vylette Strubel Lawson, MD      . hydrOXYzine (ATARAX/VISTARIL) tablet 25 mg  25 mg Oral Q6H PRN Antonieta Pertlary, Livy Ross Lawson, MD   25 mg at 04/25/18 2134  . magnesium hydroxide (MILK OF MAGNESIA) suspension 30 mL  30 mL Oral Daily PRN Antonieta Pertlary, Delaynee Alred Lawson, MD      . traZODone (DESYREL) tablet 100 mg  100 mg Oral QHS PRN Antonieta Pertlary, Sabre Leonetti Lawson, MD      . venlafaxine XR [facility](EFFEXOR-XR) 24 hr capsule 75 mg  75 mg Oral Q breakfast Antonieta Pertlary, Kamylah Manzo Lawson, MD   75 mg at 04/26/18 [id number]    Lab Results:  Results for orders placed or performed during the hospital encounter of 04/24/18 (from the past 48 hour(s))  Hemoglobin A1c     Status: None   Collection Time: 04/25/18  6:23 AM  Result Value Ref Range   Hgb A1c MFr Bld 5.2 4.8 - 5.6 %    Comment: (NOTE) Pre diabetes:          5.7%-6.4% Diabetes:              >6.4% Glycemic control for   <7.0% adults with diabetes    Mean Plasma Glucose 102.54 mg/dL    Comment: Performed at [facility], [address] [place], [place] [address]  Lipid panel     Status: Abnormal   Collection Time: 04/25/18  6:23 AM  Result Value Ref Range   Cholesterol 225 (H) 0 - 200 mg/dL   Triglycerides 440123 <102<150 mg/dL   HDL 54 >72>40 mg/dL   Total CHOL/HDL Ratio 4.2 RATIO   VLDL 25 0 - 40 mg/dL   LDL Cholesterol 536146 (H) 0 - 99 mg/dL    Comment:        Total Cholesterol/HDL:CHD Risk Coronary Heart Disease Risk Table                     Men   Women  1/2 Average Risk   3.4   3.3  Average Risk       5.0   4.4  2 X Average Risk   9.6   7.1  3 X Average Risk  23.4   11.0        Use the calculated Patient Ratio above and the CHD Risk Table to determine the patient's CHD Risk.        ATP III CLASSIFICATION (LDL):  <100     mg/dL    Optimal  [id number]  mg/dL   Near or Above  Optimal  130-159  mg/dL   Borderline  [id number]  mg/dL   High  >562>190     mg/dL   Very High Performed at [facility], [address] [place], [place] [address]   TSH     Status: None   Collection Time: 04/25/18  6:23 AM  Result Value Ref Range   TSH 3.175 0.350 - 4.500 uIU/mL    Comment: Performed by a 3rd Generation assay with a functional sensitivity of <=0.01 uIU/mL. Performed at [facility], [address] [place], [place] [address]     Blood Alcohol level:  Lab Results  Component Value Date   ETH <10 04/24/2018    Metabolic Disorder Labs: Lab Results  Component Value Date   HGBA1C 5.2 04/25/2018   MPG 102.54 04/25/2018   Lab Results  Component Value Date   PROLACTIN 17.5 07/22/2017   Lab Results  Component Value Date   CHOL 225 (H) 04/25/2018   TRIG 123 04/25/2018   HDL 54 04/25/2018   CHOLHDL 4.2 04/25/2018   VLDL 25 04/25/2018   LDLCALC 146 (H) 04/25/2018    Physical Findings: AIMS: Facial and Oral Movements Muscles of Facial Expression: None, normal Lips and Perioral Area: None, normal Jaw: None, normal Tongue: None, normal,Extremity Movements Upper (arms, wrists, hands, fingers): None, normal Lower (legs, knees, ankles, toes): None, normal, Trunk Movements Neck, shoulders, hips: None, normal, Overall Severity Severity of abnormal movements (highest score from questions above): None, normal Incapacitation due to abnormal movements: None, normal Patient's awareness of abnormal movements (rate only patient's report): No Awareness, Dental Status Current problems with teeth and/or dentures?: No Does patient usually wear dentures?: No  CIWA:  CIWA-Ar Total: 6 COWS:  COWS Total Score: 0  Musculoskeletal: Strength & Muscle Tone: within normal limits Gait & Station: normal Patient leans: N/A  Psychiatric Specialty Exam: Physical Exam  Nursing note and  vitals reviewed. Constitutional: She is oriented to person, place, and time. She appears well-developed and well-nourished.  HENT:  Head: Normocephalic and atraumatic.  Respiratory: Effort normal.  Neurological: She is oriented to person, place, and time.    ROS  Blood pressure 122/82, pulse 89, temperature 99.1 F (37.3 C), temperature source Oral, resp. rate 16, height 5\' 11"  (1.803 m), weight (!) 140.2 kg, SpO2 100 %.Body mass index is 43.1 kg/m.  General Appearance: Casual  Eye Contact:  Good  Speech:  Normal Rate  Volume:  Normal  Mood:  Anxious and Depressed  Affect:  Congruent  Thought Process:  Coherent and Descriptions of Associations: Intact  Orientation:  Full (Time, Place, and Person)  Thought Content:  Logical  Suicidal Thoughts:  No  Homicidal Thoughts:  No  Memory:  Immediate;   Fair Recent;   Fair Remote;   Fair  Judgement:  Intact  Insight:  Fair  Psychomotor Activity:  Normal  Concentration:  Concentration: Fair and Attention Span: Fair  Recall:  [employer]Fair  Fund of Knowledge:  Fair  Language:  Fair  Akathisia:  Negative  Handed:  Right  AIMS (if indicated):     Assets:  Communication Skills Desire for Improvement Financial Resources/Insurance Housing Physical Health Resilience Social Support  ADL's:  Intact  Cognition:  WNL  Sleep:  Number of Hours: 6.75     Treatment Plan Summary: Daily contact with patient to assess and evaluate symptoms and progress in treatment, Medication management and Plan : Patient is seen and examined.  Patient is a 27 year old female with the above-stated  past psychiatric history seen in follow-up.  She continues to slowly improve.  She did not sleep well last night, so I am going to increase her trazodone 200 mg p.o. nightly as needed.  She will continue on the hydroxyzine and venlafaxine XR at its current dosage.  If everything continues to do well I plan on discharging her in 1 to 2 days.  I have recommended that she seek a  sleep study after discharge from the psychiatric hospital.  Antonieta Pert, MD 04/26/2018, 10:02 AM

## 2018-04-27 ENCOUNTER — Encounter (HOSPITAL_COMMUNITY): Payer: Self-pay | Admitting: Behavioral Health

## 2018-04-27 MED ORDER — HYDROXYZINE HCL 25 MG PO TABS: 25.0000 mg | ORAL_TABLET | Freq: Four times a day (QID) | ORAL | 0 refills | Status: AC | PRN

## 2018-04-27 MED ORDER — TRAZODONE HCL 100 MG PO TABS: 100.0000 mg | ORAL_TABLET | Freq: Every evening | ORAL | 0 refills | Status: AC | PRN

## 2018-04-27 MED ORDER — VENLAFAXINE HCL ER 75 MG PO CP24: 75.0000 mg | ORAL_CAPSULE | Freq: Every day | ORAL | 0 refills | Status: AC

## 2018-04-27 NOTE — Progress Notes (Signed)
Discharge note:  Patient discharged home per MD order.  Patient received all personal belongings from unit and locker.  Patient denies any thoughts of self harm.  Reviewed AVS/transition record with patient and she indicated understanding.  Patient left ambulatory with her fiance.

## 2018-04-27 NOTE — Tx Team (Signed)
Interdisciplinary Treatment and Diagnostic Plan Update  04/27/2018 Time of Session: 0920 Francetta Foundmily Paige Steelman MRN: [id number]  Principal Diagnosis: Severe recurrent major depression without psychotic features [facility](HCC)  Secondary Diagnoses: Principal Problem:   Severe recurrent major depression without psychotic features (HCC) Active Problems:   Major depression, recurrent (HCC)   Generalized anxiety disorder   Current Medications:  No current facility-administered medications for this encounter.    Current Outpatient Medications  Medication Sig Dispense Refill  . desogestrel-ethinyl estradiol (APRI,EMOQUETTE,SOLIA) 0.15-30 MG-MCG tablet Take 1 tablet by mouth daily. 3 Package 3  . hydrOXYzine (ATARAX/VISTARIL) 25 MG tablet Take 1 tablet (25 mg total) by mouth every 6 (six) hours as needed for anxiety. 30 tablet 0  . traZODone (DESYREL) 100 MG tablet Take 1 tablet (100 mg total) by mouth at bedtime as needed for sleep. 30 tablet 0  . [START ON 04/28/2018] venlafaxine XR (EFFEXOR-XR) 75 MG 24 hr capsule Take 1 capsule (75 mg total) by mouth daily with breakfast. 30 capsule 0   PTA Medications: No medications prior to admission.    Patient Stressors: Civil Service fast streamerducational concerns Financial difficulties  Patient Strengths: Ability for insight Active sense of humor  Treatment Modalities: Medication Management, Group therapy, Case management,  1 to 1 session with clinician, Psychoeducation, Recreational therapy.   Physician Treatment Plan for Primary Diagnosis: Severe recurrent major depression without psychotic features (HCC) Long Term Goal(s): Improvement in symptoms so as ready for discharge Improvement in symptoms so as ready for discharge   Short Term Goals: Ability to identify changes in lifestyle to reduce recurrence of condition will improve Ability to verbalize feelings will improve Ability to disclose and discuss suicidal ideas Ability to demonstrate self-control will  improve Ability to identify and develop effective coping behaviors will improve Ability to maintain clinical measurements within normal limits will improve Ability to identify changes in lifestyle to reduce recurrence of condition will improve Ability to verbalize feelings will improve Ability to disclose and discuss suicidal ideas Ability to demonstrate self-control will improve Ability to identify and develop effective coping behaviors will improve Ability to maintain clinical measurements within normal limits will improve  Medication Management: Evaluate patient's response, side effects, and tolerance of medication regimen.  Therapeutic Interventions: 1 to 1 sessions, Unit Group sessions and Medication administration.  Evaluation of Outcomes: Adequate for Discharge  Physician Treatment Plan for Secondary Diagnosis: Principal Problem:   Severe recurrent major depression without psychotic features (HCC) Active Problems:   Major depression, recurrent (HCC)   Generalized anxiety disorder  Long Term Goal(s): Improvement in symptoms so as ready for discharge Improvement in symptoms so as ready for discharge   Short Term Goals: Ability to identify changes in lifestyle to reduce recurrence of condition will improve Ability to verbalize feelings will improve Ability to disclose and discuss suicidal ideas Ability to demonstrate self-control will improve Ability to identify and develop effective coping behaviors will improve Ability to maintain clinical measurements within normal limits will improve Ability to identify changes in lifestyle to reduce recurrence of condition will improve Ability to verbalize feelings will improve Ability to disclose and discuss suicidal ideas Ability to demonstrate self-control will improve Ability to identify and develop effective coping behaviors will improve Ability to maintain clinical measurements within normal limits will improve     Medication  Management: Evaluate patient's response, side effects, and tolerance of medication regimen.  Therapeutic Interventions: 1 to 1 sessions, Unit Group sessions and Medication administration.  Evaluation of Outcomes: Adequate for Discharge   RN Treatment  Plan for Primary Diagnosis: Severe recurrent major depression without psychotic features (HCC) Long Term Goal(s): Knowledge of disease and therapeutic regimen to maintain health will improve  Short Term Goals: Ability to identify and develop effective coping behaviors will improve and Compliance with prescribed medications will improve  Medication Management: RN will administer medications as ordered by provider, will assess and evaluate patient's response and provide education to patient for prescribed medication. RN will report any adverse and/or side effects to prescribing provider.  Therapeutic Interventions: 1 on 1 counseling sessions, Psychoeducation, Medication administration, Evaluate responses to treatment, Monitor vital signs and CBGs as ordered, Perform/monitor CIWA, COWS, AIMS and Fall Risk screenings as ordered, Perform wound care treatments as ordered.  Evaluation of Outcomes: Adequate for Discharge   LCSW Treatment Plan for Primary Diagnosis: Severe recurrent major depression without psychotic features (HCC) Long Term Goal(s): Safe transition to appropriate next level of care at discharge, Engage patient in therapeutic group addressing interpersonal concerns.  Short Term Goals: Engage patient in aftercare planning with referrals and resources, Increase social support and Increase skills for wellness and recovery  Therapeutic Interventions: Assess for all discharge needs, 1 to 1 time with Social worker, Explore available resources and support systems, Assess for adequacy in community support network, Educate family and significant other(s) on suicide prevention, Complete Psychosocial Assessment, Interpersonal group  therapy.  Evaluation of Outcomes: Adequate for Discharge   Progress in Treatment: Attending groups: Yes. Participating in groups: Yes. Taking medication as prescribed: Yes. Toleration medication: Yes. Family/Significant other contact made: Yes, individual(s) contacted:  partner Patient understands diagnosis: Yes. Discussing patient identified problems/goals with staff: Yes. Medical problems stabilized or resolved: Yes. Denies suicidal/homicidal ideation: Yes. Issues/concerns per patient self-inventory: No. Other: none  New problem(s) identified: No, Describe:  none  New Short Term/Long Term Goal(s):  Patient Goals:  Start meds, be in a safe, stress free place.  Discharge Plan or Barriers: [facility].  Reason for Continuation of Hospitalization: None, discharge today.  Estimated Length of Stay:discharge today.  Attendees: Patient:Carolyn Bonilla 04/27/2018   Physician: Dr. Jola Babinskilary, MD 04/27/2018   Nursing: Darcus Pesterlivette Wesech, RN 04/27/2018   RN Care Manager: 04/27/2018   Social Worker: Daleen SquibbGreg Nishka Heide, LCSW 04/27/2018   Recreational Therapist:  04/27/2018   Other:  04/27/2018   Other:  04/27/2018   Other: 04/27/2018        Scribe for Treatment Team: Lorri FrederickWierda, Chaston Bradburn Jon, LCSW 04/27/2018 3:26 PM

## 2018-04-27 NOTE — Plan of Care (Signed)
  Problem: Education: Goal: Knowledge of [facility] Education information/materials will improve Outcome: Completed/Met Goal: Emotional status will improve Outcome: Completed/Met Goal: Mental status will improve Outcome: Completed/Met Goal: Verbalization of understanding the information provided will improve Outcome: Completed/Met   Problem: Activity: Goal: Interest or engagement in activities will improve Outcome: Completed/Met Goal: Sleeping patterns will improve Outcome: Completed/Met   Problem: Coping: Goal: Ability to verbalize frustrations and anger appropriately will improve Outcome: Completed/Met Goal: Ability to demonstrate self-control will improve Outcome: Completed/Met   Problem: Health Behavior/Discharge Planning: Goal: Identification of resources available to assist in meeting health care needs will improve Outcome: Completed/Met Goal: Compliance with treatment plan for underlying cause of condition will improve Outcome: Completed/Met   Problem: Physical Regulation: Goal: Ability to maintain clinical measurements within normal limits will improve Outcome: Completed/Met   Problem: Safety: Goal: Periods of time without injury will increase Outcome: Completed/Met   Problem: Education: Goal: Ability to make informed decisions regarding treatment will improve Outcome: Completed/Met   Problem: Coping: Goal: Coping ability will improve Outcome: Completed/Met   Problem: Health Behavior/Discharge Planning: Goal: Identification of resources available to assist in meeting health care needs will improve Outcome: Completed/Met   Problem: Medication: Goal: Compliance with prescribed medication regimen will improve Outcome: Completed/Met   Problem: Self-Concept: Goal: Ability to disclose and discuss suicidal ideas will improve Outcome: Completed/Met Goal: Will verbalize positive feelings about self Outcome: Completed/Met   Problem: Education: Goal:  Utilization of techniques to improve thought processes will improve Outcome: Completed/Met Goal: Knowledge of the prescribed therapeutic regimen will improve Outcome: Completed/Met   Problem: Activity: Goal: Interest or engagement in leisure activities will improve Outcome: Completed/Met Goal: Imbalance in normal sleep/wake cycle will improve Outcome: Completed/Met   Problem: Coping: Goal: Coping ability will improve Outcome: Completed/Met Goal: Will verbalize feelings Outcome: Completed/Met   Problem: Activity: Goal: Will identify at least one activity in which they can participate Outcome: Completed/Met   Problem: Coping: Goal: Ability to identify and develop effective coping behavior will improve Outcome: Completed/Met Goal: Ability to interact with others will improve Outcome: Completed/Met Goal: Demonstration of participation in decision-making regarding own care will improve Outcome: Completed/Met Goal: Ability to use eye contact when communicating with others will improve Outcome: Completed/Met   Problem: Health Behavior/Discharge Planning: Goal: Identification of resources available to assist in meeting health care needs will improve Outcome: Completed/Met   Problem: Self-Concept: Goal: Will verbalize positive feelings about self Outcome: Completed/Met

## 2018-04-27 NOTE — [facility] Suicide Risk Assessment (Signed)
[facility]   Principal Problem: <principal problem not specified> Discharge Diagnoses:  Patient Active Problem List   Diagnosis Date Noted  . Major depression, recurrent (HCC) [F33.9] 04/24/2018  . Severe recurrent major depression without psychotic features (HCC) [F33.2] 04/24/2018  . Generalized anxiety disorder [F41.1]   . PCOS (polycystic ovarian syndrome) [E28.2] 07/22/2017  . Abnormal Pap smear of cervix [R87.619] 07/09/2017  . Depression with anxiety [F41.8] 06/27/2017  . Secondary amenorrhea [N91.1] 06/26/2017    Total Time spent with patient: 15 minutes  Musculoskeletal: Strength & Muscle Tone: within normal limits Gait & Station: normal Patient leans: N/A  Psychiatric Specialty Exam: Review of Systems  All other systems reviewed and are negative.   Blood pressure 129/76, pulse 98, temperature 98.8 F (37.1 C), temperature source Oral, resp. rate 20, height 5\' 11"  (1.803 m), weight (!) 140.2 kg, SpO2 100 %.Body mass index is 43.1 kg/m.  General Appearance: Casual  Eye Contact::  Good  Speech:  Normal Rate409  Volume:  Normal  Mood:  Euthymic  Affect:  Congruent  Thought Process:  Coherent and Descriptions of Associations: Intact  Orientation:  Full (Time, Place, and Person)  Thought Content:  Logical  Suicidal Thoughts:  No  Homicidal Thoughts:  No  Memory:  Immediate;   Good Recent;   Good Remote;   Good  Judgement:  Intact  Insight:  Fair  Psychomotor Activity:  Normal  Concentration:  Good  Recall:  Good  Fund of Knowledge:Good  Language: Good  Akathisia:  Negative  Handed:  Right  AIMS (if indicated):     Assets:  Communication Skills Desire for Improvement Housing Intimacy Physical Health Resilience Talents/Skills  Sleep:  Number of Hours: 6.75  Cognition: WNL  ADL's:  Intact   Mental Status Per Nursing Assessment::   On Admission:  Suicidal ideation indicated by patient  Demographic Factors:  Adolescent or  young adult, Caucasian and Gay, lesbian, or bisexual orientation  Loss Factors: NA  Historical Factors: Impulsivity  Risk Reduction Factors:   Employed, Living with another person, especially a relative, Positive social support and Positive coping skills or problem solving skills  Continued Clinical Symptoms:  Depression:   Impulsivity  Cognitive Features That Contribute To Risk:  None    Suicide Risk:  Minimal: No identifiable suicidal ideation.  Patients presenting with no risk factors but with morbid ruminations; may be classified as minimal risk based on the severity of the depressive symptoms    Plan Of Care/Follow-up recommendations:  Activity:  ad lib  Antonieta PertGreg Lawson Schawn Byas, MD 04/27/2018, 7:45 AM

## 2018-04-27 NOTE — Discharge Summary (Signed)
Physician Discharge Summary Note  Patient:  Carolyn Bonilla is an 27 y.o., female MRN:  [id number] DOB:  05-Oct-1990 Patient phone:  [phone] (home)  Patient address:   [address] [place] [address],  Total Time spent with patient: 30 minutes  Date of Admission:  04/24/2018 Date of Discharge: 04/27/2018  Reason for Admission:   suicidal ideation  Principal Problem: Severe recurrent major depression without psychotic features [facility]) Discharge Diagnoses: Patient Active Problem List   Diagnosis Date Noted  . Severe recurrent major depression without psychotic features (HCC) [F33.2] 04/24/2018    Priority: High  . Major depression, recurrent (HCC) [F33.9] 04/24/2018  . Generalized anxiety disorder [F41.1]   . PCOS (polycystic ovarian syndrome) [E28.2] 07/22/2017  . Abnormal Pap smear of cervix [R87.619] 07/09/2017  . Depression with anxiety [F41.8] 06/27/2017  . 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  She received Celexa from her OB/GYN.  This made her more suicidal.  She received an unspecified medication as an adolescent, and that was not effective.  She has been in therapy.  This is her first psychiatric admission.  Past Medical History: History reviewed. No pertinent past medical history. History reviewed. No pertinent surgical history. Family History:  Family History  Problem Relation Age of Onset  . Diabetes Paternal Grandmother   . Hypertension Paternal Grandmother   . Heart failure Father    Family Psychiatric  History: Patient stated that both her mother and father had issues with depression she stated her sister also has depression, and she takes Effexor. Social History:  Social History   Substance and Sexual Activity  Alcohol Use Never  . Frequency: Never     Social History   Substance and Sexual Activity  Drug Use Yes  . Types: Marijuana    Social  History   Socioeconomic History  . Marital status: Married    Spouse name: Not on file  . Number of children: Not on file  . Years of education: Not on file  . Highest education level: Not on file  Occupational History  . Not on file  Social Needs  . Financial resource strain: Very hard  . Food insecurity:    Worry: Never true    Inability: Often true  . Transportation needs:    Medical: No    Non-medical: Yes  Tobacco Use  . Smoking status: Current Every Day Smoker    Packs/day: 0.50    Years: 0.00    Pack years: 0.00    Types: Cigarettes  . Smokeless tobacco: Never Used  Substance and Sexual Activity  . Alcohol use: Never    Frequency: Never  . Drug use: Yes    Types: Marijuana  . Sexual activity: Yes    Birth control/protection: None  Lifestyle  . Physical activity:    Days per week: 0 days    Minutes per session: 0 min  . Stress: Very much  Relationships  . Social connections:    Talks on phone: Once a week    Gets together: Never    Attends religious service: Never    Active member of club or organization: Yes    Attends meetings of clubs or organizations: Never    Relationship status: Married  Other Topics Concern  . Not on file  Social History Narrative  . Not on file    Hospital Course:  Patient is seen and examined.  Patient  is a 27 year old female with who [facility] with suicidal ideation.  The patient has a multiyear history of depression going back to adolescence, but is not really sought formal treatment.  She stated she is been very much stressed out over the recent events.  The father of her best friend died 2 weeks ago from an acute heart issue.  Unfortunately he died in the driveway of the patient's best friend's home.  Because of the circumstances of his death she witnessed him lying dead in the driveway.  This disturbed her.  It brought back a good deal of emotion about the death of her father from  congestive heart failure 2 years ago.  Since the death of her friends father she is began to feel even more helpless, hopeless and worthless.  She has had anhedonia.  She is been having thoughts of self-harm.  She stated that she first saw on about depression and anxiety during adolescence.  Her primary care provider gave her unspecified medication which she took for several months, but was not beneficial.  She did also receive Celexa from her OB GYN and she stated that the Celexa made her feel even more suicidal.  She is also been in therapy, but she did not find that form of therapy beneficial.  She denied any history of burning or cutting behaviors.  She stated she had thought about cutting herself with razors after the above witnessed event.  She stated she also finds herself being very anxious and unable to relax at all.  She feels like she is under a great deal of stress at work as well as every day stresses of life.  She and her partner live together, but the relationship is having some difficulties as well.  She admitted only sleeping approximately 4 hours a night.  She admitted to anhedonia, helplessness, hopelessness and worthlessness.  Her drug screen was positive for marijuana.  She said the marijuana helps her with her anxiety.  She was admitted to the hospital for evaluation and stabilization.  Irving Burtonmily was started on medication regimen for presenting symptoms. She was medicated & discharged on;   Trazodone 100 mg p.o. nightly for insomnia venlafaxine XR 75 mg po daily for depression Vistaril 25 mg po q6hrs as needed for anxiety  Patient has been adherent with treatment recommendations. Patient tolerated the medications without any reported side effects are adverse reactions.  Patient was enrolled & participated in the group counseling sessions being offerred & held on this unit. Patient learned coping skills.  Irving Burtonmily is seen today by the attending psychiatrist for discharge. Patient denies any  delusions, no hallucinations or other psychotic process. Patient denies active or passive suicidal thoughts. No thoughts of violence. No craving for drugs. Endorses overall improvement in mood emotional state.    Nursing staff reports that patient has been appropriate on the unit. Patient has been interacting well with peers. No behavioral issues. Patient has not voiced any suicidal thoughts. Prior to discharge. Patient was discussed at the treatment team meeting this morning. Team members feels that patient is back to her baseline level of functioning. Team agrees with plan to discharge patient today. Patient was provided with all follow-up information to resume mental health treatment following discharge as noted below. Irving Burtonmily was provided with a [facility].  [facility] in no apparent distress. Transportation per patient/ family arrangement.    Labs: Reviewed and noted  as below. TSH and HgbA1c normal. CBC normal. UDS positive  for THC. CMP glucose 103 and total protein 3.4 otherwise normal.   Physical Findings: AIMS: Facial and Oral Movements Muscles of Facial Expression: None, normal Lips and Perioral Area: None, normal Jaw: None, normal Tongue: None, normal,Extremity Movements Upper (arms, wrists, hands, fingers): None, normal Lower (legs, knees, ankles, toes): None, normal, Trunk Movements Neck, shoulders, hips: None, normal, Overall Severity Severity of abnormal movements (highest score from questions above): None, normal Incapacitation due to abnormal movements: None, normal Patient's awareness of abnormal movements (rate only patient's report): No Awareness, Dental Status Current problems with teeth and/or dentures?: No Does patient usually wear dentures?: No  CIWA:  CIWA-Ar Total: 6 COWS:  COWS Total Score: 0  Musculoskeletal: Strength & Muscle Tone: within normal limits Gait & Station: normal Patient leans:  N/A  Psychiatric Specialty Exam: SEE SRA BY MD  Physical Exam  Nursing note and vitals reviewed. Constitutional: She is oriented to person, place, and time.  Neurological: She is alert and oriented to person, place, and time.    Review of Systems  Psychiatric/Behavioral: Negative for hallucinations, memory loss, substance abuse and suicidal ideas. Depression: stable. Nervous/anxious: stable. Insomnia: stable.   All other systems reviewed and are negative.   Blood pressure 129/76, pulse 98, temperature 98.8 F (37.1 C), temperature source Oral, resp. rate 20, height 5\' 11"  (1.803 m), weight (!) 140.2 kg, SpO2 100 %.Body mass index is 43.1 kg/m.    Have you used any form of tobacco in the last 30 days? (Cigarettes, Smokeless Tobacco, Cigars, and/or Pipes): Yes  Has this patient used any form of tobacco in the last 30 days? (Cigarettes, Smokeless Tobacco, Cigars, and/or Pipes) Yes, A prescription for an FDA-approved tobacco cessation medication was offered at discharge and the patient refused  Blood Alcohol level:  Lab Results  Component Value Date   [facility] <10 04/24/2018    Metabolic Disorder Labs:  Lab Results  Component Value Date   HGBA1C 5.2 04/25/2018   MPG 102.54 04/25/2018   Lab Results  Component Value Date   PROLACTIN 17.5 07/22/2017   Lab Results  Component Value Date   CHOL 225 (H) 04/25/2018   TRIG 123 04/25/2018   HDL 54 04/25/2018   CHOLHDL 4.2 04/25/2018   VLDL 25 04/25/2018   LDLCALC 146 (H) 04/25/2018    See Psychiatric Specialty Exam and Suicide Risk Assessment completed by Attending Physician prior to discharge.  Discharge destination:  Home  Is patient on multiple antipsychotic therapies at discharge:  No   Has Patient had three or more failed trials of antipsychotic monotherapy by history:  No  Recommended Plan for Multiple Antipsychotic Therapies: NA  Discharge Instructions    Discharge instructions   Complete by:  As directed    Discharge  Recommendations:  The patient is being discharged to her family. Patient is to take her discharge medications as ordered.  See follow up above. We recommend that she participate in individual therapy to target depression, anxiety, suicidal thoughts and improving coping skills.  Patient will benefit from monitoring of recurrence suicidal ideation since patient is on antidepressant medication. The patient should abstain from all illicit substances and alcohol.  If the patient's symptoms worsen or do not continue to improve or if the patient becomes actively suicidal or homicidal then it is recommended that the patient return to the closest hospital emergency room or call 911 for further evaluation and treatment.  National Suicide Prevention Lifeline 1800-SUICIDE or  202-316-05161800-804-698-2314. Please follow up with your primary medical doctor for all other medical needs.  The patient has been educated on the possible side effects to medications and she/her guardian is to contact a medical professional and inform outpatient provider of any new side effects of medication. She is to take regular diet and activity as tolerated.  Patient would benefit from a daily moderate exercise. Family was educated about removing/locking any firearms, medications or dangerous products from the home.     Allergies as of 04/27/2018   No Known Allergies     Medication List    STOP taking these medications   busPIRone 10 MG tablet Commonly known as:  BUSPAR   citalopram 20 MG tablet Commonly known as:  CELEXA   medroxyPROGESTERone 10 MG tablet Commonly known as:  PROVERA     TAKE these medications     Indication  desogestrel-ethinyl estradiol 0.15-30 MG-MCG tablet Commonly known as:  APRI,EMOQUETTE,SOLIA Take 1 tablet by mouth daily.  Indication:  Birth Control Treatment   hydrOXYzine 25 MG tablet Commonly known as:  ATARAX/VISTARIL Take 1 tablet (25 mg total) by mouth every 6 (six) hours as needed for anxiety.   Indication:  Feeling Anxious   traZODone 100 MG tablet Commonly known as:  DESYREL Take 1 tablet (100 mg total) by mouth at bedtime as needed for sleep.  Indication:  Trouble Sleeping   venlafaxine XR 75 MG 24 hr capsule Commonly known as:  EFFEXOR-XR Take 1 capsule (75 mg total) by mouth daily with breakfast. Start taking on:  04/28/2018  Indication:  Major Depressive Disorder      Follow-up Information    [facility] Go on 05/11/2018.   Why:  Please attend your therapy appt with Berniece AndreasLydia Long on Monday, 05/11/18, at 10:00am.  Please electronically complete the intake packet that will be sent to your email by this Friday, 05/01/09, to confirm your appt. Contact information: [address] [place] [address] [phone]        [facility] Go on 05/04/2018.   Why:  Please attend your medication appt with Dr. Jackquline BerlinIzediuno on Monday, 05/04/18, at 2:00pm. Contact information: [address] [place], [place] [address] P: [phone] F: [phone]          Follow-up recommendations:  Follow up with your outpatient provided for any medical issues. Activity & diet as recommended by your primary care provider.  Comments:  See above.  Signed: Denzil MagnusonLaShunda Carmelle Bamberg, NP 04/27/2018, 2:35 PM

## 2018-04-27 NOTE — Progress Notes (Signed)
  [facility] :  Will you be returning to the same living situation after discharge:  Yes,  with partner At discharge, do you have transportation home?: Yes,  grandmother Do you have the ability to pay for your medications: Yes,  BCBS  Release of information consent forms completed and in the chart;  Patient's signature needed at discharge.  Patient to Follow up at: Follow-up Information    [facility] Go on 05/11/2018.   Why:  Please attend your therapy appt with Berniece AndreasLydia Long on Monday, 05/11/18, at 10:00am.  Please electronically complete the intake packet that will be sent to your email by this Friday, 05/01/09, to confirm your appt. Contact information: [address] [place] [address] [phone]        [facility] Go on 05/04/2018.   Why:  Please attend your medication appt with Dr. Jackquline BerlinIzediuno on Monday, 05/04/18, at 2:00pm. Contact information: [address] [place], [place] [address] P: [phone] F: [phone]          Next level of care provider has access to [facility] Link:no  Safety Planning and Suicide Prevention discussed: Yes,  with partner  Have you used any form of tobacco in the last 30 days? (Cigarettes, Smokeless Tobacco, Cigars, and/or Pipes): Yes  Has patient been referred to the Quitline?: Patient refused referral  Patient has been referred for addiction treatment: Pt. refused referral  Lorri FrederickWierda, Carlitos Bottino Jon, LCSW 04/27/2018, 10:42 AM

## 2018-04-27 NOTE — [facility] Suicide Risk Assessment (Signed)
[name] INPATIENT:  Family/Significant Other Suicide Prevention Education  Suicide Prevention Education:  Education Completed; Cristy Folkslex Hernandez, partner, [phone], has been identified by the patient as the family member/significant other with whom the patient will be residing, and identified as the person(s) who will aid the patient in the event of a mental health crisis (suicidal ideations/suicide attempt).  With written consent from the patient, the family member/significant other has been provided the following suicide prevention education, prior to the and/or following the discharge of the patient.  The suicide prevention education provided includes the following:  Suicide risk factors  Suicide prevention and interventions  National Suicide Hotline telephone number  [facility] assessment telephone number  [facility] [place] Emergency Assistance 911  [facility] and/or Residential Mobile Crisis Unit telephone number  Request made of family/significant other to:  Remove weapons (e.g., guns, rifles, knives), all items previously/currently identified as safety concern.  No guns in the home, per [facility].  Remove drugs/medications (over-the-counter, prescriptions, illicit drugs), all items previously/currently identified as a safety concern.  The family member/significant other verbalizes understanding of the suicide prevention education information provided.  The family member/significant other agrees to remove the items of safety concern listed above.  Alex reports pt's grandmother is going to be staying with her while Trinna Postlex is at work this week to make sure she is doing OK.    Lorri FrederickWierda, Kalyani Maeda Jon, LCSW 04/27/2018, 10:38 AM

## 2018-04-27 NOTE — Progress Notes (Signed)
Adult Psychoeducational Group Note  Date:  04/27/2018 Time:  12:45 AM  Group Topic/Focus:  Wrap-Up Group Participation Level:  Active  Participation Quality:  Appropriate  Affect:  Appropriate  Cognitive:  Appropriate  Insight: Appropriate  Engagement in Group:  Engaged  Modes of Intervention:  Discussion  Additional Comments:  Pt stated her goals were to figure out when she would be discharged and spoke with social worker about resources she can utilize once she is finally discharged. Pt stated she met her goals. Pt rated the day at a 7/10.  [name] 04/27/2018, 12:45 AM

## 2018-04-27 NOTE — Progress Notes (Signed)
Recreation Therapy Notes  Date: 8.19.19 Time: 0930 Location: [address] Dayroom  Group Topic: Stress Management  Goal Area(s) Addresses:  Patient will verbalize importance of using healthy stress management.  Patient will identify positive emotions associated with healthy stress management.   Intervention: Stress Management  Activity :  Guided Imagery.  LRT introduced the stress management technique of guided imagery.  LRT read a script that allowed patients to visualize their peaceful place.  Patients were to follow along as script was read.  Education: Stress Management, Discharge Planning.   Education Outcome: Acknowledges edcuation/In group clarification offered/Needs additional education  Clinical Observations/Feedback: Pt did not attend group.     Caroll RancherMarjette Wirt Hemmerich, LRT/CTRS         Caroll RancherLindsay, Nanetta Wiegman A 04/27/2018 12:46 PM

## 2018-06-11 ENCOUNTER — Encounter: Payer: BLUE CROSS/BLUE SHIELD | Admitting: Women's Health

## 2018-06-11 ENCOUNTER — Encounter: Payer: Self-pay | Admitting: Women's Health

## 2018-06-12 NOTE — Progress Notes (Signed)
This encounter was created in error - please disregard. Pt scheduled pap & physical, hasn't been 68yr yet.

## 2018-07-23 ENCOUNTER — Other Ambulatory Visit: Payer: Self-pay | Admitting: Women's Health

## 2018-08-10 ENCOUNTER — Encounter (HOSPITAL_COMMUNITY): Payer: Self-pay | Admitting: *Deleted

## 2018-08-10 ENCOUNTER — Emergency Department (HOSPITAL_COMMUNITY): Payer: No Typology Code available for payment source

## 2018-08-10 ENCOUNTER — Other Ambulatory Visit: Payer: Self-pay

## 2018-08-10 ENCOUNTER — Emergency Department (HOSPITAL_COMMUNITY)
Admission: EM | Admit: 2018-08-10 | Discharge: 2018-08-10 | Disposition: A | Discharge status: Home / Self Care | Payer: No Typology Code available for payment source | Attending: Emergency Medicine | Admitting: Emergency Medicine

## 2018-08-10 DIAGNOSIS — S20219A Contusion of unspecified front wall of thorax, initial encounter: Secondary | ICD-10-CM | POA: Insufficient documentation

## 2018-08-10 DIAGNOSIS — Z79899 Other long term (current) drug therapy: Secondary | ICD-10-CM | POA: Insufficient documentation

## 2018-08-10 DIAGNOSIS — Z23 Encounter for immunization: Secondary | ICD-10-CM | POA: Insufficient documentation

## 2018-08-10 DIAGNOSIS — S8991XA Unspecified injury of right lower leg, initial encounter: Secondary | ICD-10-CM | POA: Diagnosis present

## 2018-08-10 DIAGNOSIS — Y9389 Activity, other specified: Secondary | ICD-10-CM | POA: Insufficient documentation

## 2018-08-10 DIAGNOSIS — S82891A Other fracture of right lower leg, initial encounter for closed fracture: Secondary | ICD-10-CM | POA: Diagnosis not present

## 2018-08-10 DIAGNOSIS — Y999 Unspecified external cause status: Secondary | ICD-10-CM | POA: Diagnosis not present

## 2018-08-10 DIAGNOSIS — F17210 Nicotine dependence, cigarettes, uncomplicated: Secondary | ICD-10-CM | POA: Insufficient documentation

## 2018-08-10 DIAGNOSIS — Y92410 Unspecified street and highway as the place of occurrence of the external cause: Secondary | ICD-10-CM | POA: Insufficient documentation

## 2018-08-10 MED ORDER — OXYCODONE-ACETAMINOPHEN 5-325 MG PO TABS
1.0000 | ORAL_TABLET | Freq: Once | ORAL | Status: AC
  Administered 2018-08-10: 1 via ORAL
  Filled 2018-08-10: qty 1

## 2018-08-10 MED ORDER — ONDANSETRON 8 MG PO TBDP
8.0000 mg | ORAL_TABLET | Freq: Once | ORAL | Status: AC
  Administered 2018-08-10: 8 mg via ORAL
  Filled 2018-08-10: qty 1

## 2018-08-10 MED ORDER — OXYCODONE-ACETAMINOPHEN 5-325 MG PO TABS: 1.0000 | ORAL_TABLET | ORAL | 0 refills | Status: DC | PRN

## 2018-08-10 MED ORDER — OXYCODONE-ACETAMINOPHEN 5-325 MG PO TABS: 1.0000 | ORAL_TABLET | ORAL | 0 refills | Status: AC | PRN

## 2018-08-10 MED ORDER — TETANUS-DIPHTH-ACELL PERTUSSIS 5-2.5-18.5 LF-MCG/0.5 IM SUSP
0.5000 mL | Freq: Once | INTRAMUSCULAR | Status: AC
  Administered 2018-08-10: 0.5 mL via INTRAMUSCULAR
  Filled 2018-08-10: qty 0.5

## 2018-08-10 MED ORDER — HYDROMORPHONE HCL 1 MG/ML IJ SOLN
1.0000 mg | Freq: Once | INTRAMUSCULAR | Status: AC
  Administered 2018-08-10: 1 mg via INTRAMUSCULAR
  Filled 2018-08-10: qty 1

## 2018-08-10 MED ORDER — IBUPROFEN 800 MG PO TABS: 800.0000 mg | ORAL_TABLET | Freq: Three times a day (TID) | ORAL | 0 refills | Status: AC
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
## 2018-08-10 NOTE — Discharge Instructions (Addendum)
Elevate your right foot when possible.  Apply ice packs on and off to your chest wall.  Use crutches for weightbearing.  Call 1 of the orthopedic providers listed to arrange a follow-up appointment.  Return to the ER for any worsening symptoms.
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
## 2018-08-10 NOTE — ED Provider Notes (Signed)
Medical screening examination/treatment/procedure(s) were performed by non-physician practitioner and as supervising physician I was immediately available for consultation/collaboration.  ED ECG REPORT  I personally interpreted this EKG   Date: 08/10/2018   Rate: 76  Rhythm: normal sinus rhythm  QRS Axis: normal  Intervals: normal  ST/T Wave abnormalities: normal  Conduction Disutrbances:none  Narrative Interpretation:   Old EKG Reviewed: none available    Eber HongMiller, Nykerria Macconnell, MD 08/10/18 1949
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
## 2018-08-10 NOTE — ED Notes (Signed)
prepack Percocet given to pateint

## 2018-08-11 ENCOUNTER — Telehealth: Payer: Self-pay | Admitting: Orthopedic Surgery

## 2018-08-11 MED FILL — Oxycodone w/ Acetaminophen Tab 5-325 MG: ORAL | Qty: 6 | Status: AC

## 2018-08-11 NOTE — Telephone Encounter (Signed)
Again over booked for Wednesday  Schedule appointment next week when we have the appropriate number of patients booked reminder 16 in the morning 16 in the afternoon maximum amount
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
## 2018-08-12 NOTE — Telephone Encounter (Signed)
Called patient; scheduled appointment. Patient aware.

## 2018-08-19 ENCOUNTER — Encounter: Payer: Self-pay | Admitting: Orthopedic Surgery

## 2018-08-19 ENCOUNTER — Encounter

## 2018-08-19 ENCOUNTER — Ambulatory Visit: Payer: Self-pay | Admitting: Orthopedic Surgery

## 2018-08-19 VITALS — BP 165/99 | HR 102 | Ht 72.0 in

## 2018-08-19 DIAGNOSIS — S93401A Sprain of unspecified ligament of right ankle, initial encounter: Secondary | ICD-10-CM

## 2018-08-19 NOTE — Patient Instructions (Signed)
Contrast baths are excellent for joints of the body which have been sprained or strained and have swelling. Professional athletes use this to reduce swelling and this helps them to resume activity as soon as possible.  Contrast baths consist of placing a foot or hand into a basin of warm (not hot) water for a short period of time and then taking the extremity and moving it immediately into a solution of ice water. It is kept in the ice cold water for a short period of time, as long as the patient can tolerate, then moved back into the warm solution.  The hand or foot is then removed and put back in the cold solution and kept there as long as possible and then removed and no further soaks are done.   When you first start doing this you will experience the "pins and needle" effect putting your hand in the cold solution. You may only tolerate the ice cold solution for a period of 5 to 30 seconds. You will not like this at first. It will hurt. Please be patient and attempt it as best as you can.   The goal is to place the extremity in the cold water for 5 minutes then place in warm water for 5 min. Repeating until 30 minutes have passed. (that's 3 times in each)  In a few days you will enjoy having your foot or hand in the cold water. As the treatment progresses, you will then find the pins and needle effect taking place after you have had your extremity in the cold solution and then placing it in the warm. It will then be the warm solution that you will dread.  You should notice decreased swelling in your extremity.   After finishing the contrast baths, you need to elevate your extremity for 10 to 15 minutes. For the ankle you will have your toes higher than your nose, and for the hand you will need your hand above the nose as well.   You will need to continue this for 7-10 days unless otherwise directed.    If you have any questions, please contact our office during office hours.  Ankle  Exercises Ask your health care provider which exercises are safe for you. Do exercises exactly as told by your health care provider and adjust them as directed. It is normal to feel mild stretching, pulling, tightness, or discomfort as you do these exercises, but you should stop right away if you feel sudden pain or your pain gets worse. Do not begin these exercises until told by your health care provider. Stretching and range of motion exercises These exercises warm up your muscles and joints and improve the movement and flexibility of your ankle. These exercises also help to relieve pain, numbness, and tingling. Exercise A: Dorsiflexion/Plantar Flexion  1. Sit with your __________ knee straight or bent. Do not rest your foot on anything. 2. Flex your __________ ankle to tilt the top of your foot toward your shin. 3. Hold this position for __________ seconds. 4. Point your toes downward to tilt the top of your foot away from your shin. 5. Hold this position for __________ seconds. Repeat __________ times. Complete this exercise __________ times a day. Exercise B: Ankle Alphabet  1. Sit with your __________ foot supported at your lower leg. ? Do not rest your foot on anything. ? Make sure your foot has room to move freely. 2. Think of your __________ foot as a paintbrush, and move your  foot to trace each letter of the alphabet in the air. Keep your hip and knee still while you trace. Make the letters as large as you can without increasing any discomfort. 3. Trace every letter from A to Z. Repeat __________ times. Complete this exercise __________ times a day. Exercise C: Ankle Dorsiflexion, Passive 1. Sit on a chair that is placed on a non-carpeted surface. 2. Place your __________ foot on the floor, directly under your __________ knee. Extend your __________ leg for support. 3. Keeping your heel down, slide your __________ foot back toward the chair until you feel a stretch at your ankle or  calf. If you do not feel a stretch, slide your buttocks forward to the edge of the chair. 4. Hold this stretch for __________ seconds. Repeat __________ times. Complete this stretch __________ times a day. Strengthening exercises These exercises build strength and endurance in your ankle. Endurance is the ability to use your muscles for a long time, even after they get tired. Exercise D: Dorsiflexors  1. Secure a rubber exercise band or tube to an object, such as a table leg, that will stay still when the band is pulled. Secure the other end around your __________ foot. 2. Sit on the floor, facing the object with your __________ leg extended. The band or tube should be slightly tense when your foot is relaxed. 3. Slowly flex your __________ ankle and toes to bring your foot toward you. 4. Hold this position for __________ seconds. 5. Slowly return your foot to the starting position, controlling the band as you do that. Repeat __________ times. Complete this exercise __________ times a day. Exercise E: Plantar Flexors  1. Sit on the floor with your __________ leg extended. 2. Loop a rubber exercise band or tube around the ball of your __________ foot. The ball of your foot is on the walking surface, right under your toes. The band or tube should be slightly tense when your foot is relaxed. 3. Slowly point your toes downward, pushing them away from you. 4. Hold this position for __________ seconds. 5. Slowly release the tension in the band or tube, controlling smoothly until your foot is back in the starting position. Repeat __________ times. Complete this exercise __________ times a day. Exercise F: Towel Curls  1. Sit in a chair on a non-carpeted surface, and put your feet on the floor. 2. Place a towel in front of your feet. If told by your health care provider, add __________ to the end of the towel. 3. Keeping your heel on the floor, put your __________ foot on the towel. 4. Pull the  towel toward you by grabbing the towel with your toes and curling them under. Keep your heel on the floor. 5. Let your toes relax. 6. Grab the towel again. Keep going until the towel is completely underneath your foot. Repeat __________ times. Complete this exercise __________ times a day. Exercise G: Heel Raise ( Plantar Flexors, Standing) 1. Stand with your feet shoulder-width apart. 2. Keep your weight spread evenly over the width of your feet while you rise up on your toes. Use a wall or table to steady yourself, but try not to use it for support. 3. If this exercise is too easy, try these options: ? Shift your weight toward your __________ leg until you feel challenged. ? If told by your health care provider, lift your uninjured leg off the floor. 4. Hold this position for __________ seconds. Repeat __________ times. Complete this  exercise __________ times a day. Exercise H: Tandem Walking 1. Stand with one foot directly in front of the other. 2. Slowly raise your back foot up, lifting your heel before your toes, and place it directly in front of your other foot. 3. Continue to walk in this heel-to-toe way for __________ or for as long as told by your health care provider. Have a countertop or wall nearby to use if needed to keep your balance, but try not to hold onto anything for support. Repeat __________ times. Complete this exercises __________ times a day. This information is not intended to replace advice given to you by your health care provider. Make sure you discuss any questions you have with your health care provider. Document Released: 07/10/2005 Document Revised: 04/25/2016 Document Reviewed: 05/14/2015 Elsevier Interactive Patient Education  2018 [employer].
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
## 2018-08-26 ENCOUNTER — Ambulatory Visit: Payer: Self-pay | Admitting: Orthopedic Surgery

## 2018-08-26 ENCOUNTER — Encounter: Payer: Self-pay | Admitting: Orthopedic Surgery

## 2018-08-26 VITALS — BP 154/95 | HR 141 | Ht 72.0 in

## 2018-08-26 DIAGNOSIS — S93401D Sprain of unspecified ligament of right ankle, subsequent encounter: Secondary | ICD-10-CM
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
## 2018-08-26 NOTE — Patient Instructions (Addendum)
Gradually increase your weight bearing on your ankle, keep it wrapped to help with swelling and discomfort.   Out of work 1 week Continue the ibuprofen for pain Start ankle exercises twice a day Wear ankle sleeve starting in 1 week Full weightbearing self wean crutches    Ankle Exercises Ask your health care provider which exercises are safe for you. Do exercises exactly as told by your health care provider and adjust them as directed. It is normal to feel mild stretching, pulling, tightness, or discomfort as you do these exercises, but you should stop right away if you feel sudden pain or your pain gets worse. Do not begin these exercises until told by your health care provider. Stretching and range of motion exercises These exercises warm up your muscles and joints and improve the movement and flexibility of your ankle. These exercises also help to relieve pain, numbness, and tingling. Exercise A: Dorsiflexion/plantar flexion  1. Sit with your __________ knee straight or bent. Do not rest your foot on anything. 2. Flex your __________ ankle to tilt the top of your foot toward your shin. 3. Hold this position for __________ seconds. 4. Point your toes downward to tilt the top of your foot away from your shin. 5. Hold this position for __________ seconds. Repeat __________ times. Complete this exercise __________ times a day. Exercise B: Ankle alphabet  1. Sit with your __________ foot supported at your lower leg. ? Do not rest your foot on anything. ? Make sure your foot has room to move freely. 2. Think of your __________ foot as a paintbrush, and move your foot to trace each letter of the alphabet in the air. Keep your hip and knee still while you trace. Make the letters as large as you can without increasing any discomfort. 3. Trace every letter from A to Z. Repeat __________ times. Complete this exercise __________ times a day. Exercise C: Ankle dorsiflexion, passive 1. Sit on a  chair that is placed on a non-carpeted surface. 2. Place your __________ foot on the floor, directly under your __________ knee. Extend your __________ leg for support. 3. Keeping your heel down, slide your __________ foot back toward the chair until you feel a stretch at your ankle or calf. If you do not feel a stretch, slide your buttocks forward to the edge of the chair. 4. Hold this stretch for __________ seconds. Repeat __________ times. Complete this stretch __________ times a day. Strengthening exercises These exercises build strength and endurance in your ankle. Endurance is the ability to use your muscles for a long time, even after they get tired. Exercise D: Dorsiflexors  1. Secure a rubber exercise band or tube to an object, such as a table leg, that will stay still when the band is pulled. Secure the other end around your __________ foot. 2. Sit on the floor, facing the object with your __________ leg extended. The band or tube should be slightly tense when your foot is relaxed. 3. Slowly flex your __________ ankle and toes to bring your foot toward you. 4. Hold this position for __________ seconds. 5. Slowly return your foot to the starting position, controlling the band as you do that. Repeat __________ times. Complete this exercise __________ times a day. Exercise E: Plantar flexors  1. Sit on the floor with your __________ leg extended. 2. Loop a rubber exercise band or tube around the ball of your __________ foot. The ball of your foot is on the walking surface, right  under your toes. The band or tube should be slightly tense when your foot is relaxed. 3. Slowly point your toes downward, pushing them away from you. 4. Hold this position for __________ seconds. 5. Slowly release the tension in the band or tube, controlling smoothly until your foot is back in the starting position. Repeat __________ times. Complete this exercise __________ times a day. Exercise F: Towel  curls  1. Sit in a chair on a non-carpeted surface, and put your feet on the floor. 2. Place a towel in front of your feet. If told by your health care provider, add __________ to the end of the towel. 3. Keeping your heel on the floor, put your __________ foot on the towel. 4. Pull the towel toward you by grabbing the towel with your toes and curling them under. Keep your heel on the floor. 5. Let your toes relax. 6. Grab the towel again. Keep going until the towel is completely underneath your foot. Repeat __________ times. Complete this exercise __________ times a day. Exercise G: Heel raise (plantar flexors, standing)  1. Stand with your feet shoulder-width apart. 2. Keep your weight spread evenly over the width of your feet while you rise up on your toes. Use a wall or table to steady yourself, but try not to use it for support. 3. If this exercise is too easy, try these options: ? Shift your weight toward your __________ leg until you feel challenged. ? If told by your health care provider, lift your uninjured leg off the floor. 4. Hold this position for __________ seconds. Repeat __________ times. Complete this exercise __________ times a day. Exercise H: Tandem walking 1. Stand with one foot directly in front of the other. 2. Slowly raise your back foot up, lifting your heel before your toes, and place it directly in front of your other foot. 3. Continue to walk in this heel-to-toe way for __________ or for as long as told by your health care provider. Have a countertop or wall nearby to use if needed to keep your balance, but try not to hold onto anything for support. Repeat __________ times. Complete this exercises __________ times a day. This information is not intended to replace advice given to you by your health care provider. Make sure you discuss any questions you have with your health care provider. Document Released: 07/10/2005 Document Revised: 04/10/2017 Document Reviewed:  05/14/2015 [employer] Interactive Patient Education  2019 [employer]

## 2018-09-14 ENCOUNTER — Ambulatory Visit: Payer: Self-pay | Admitting: Orthopedic Surgery

## 2018-09-14 ENCOUNTER — Encounter: Payer: Self-pay | Admitting: Orthopedic Surgery

## 2018-09-14 VITALS — BP 118/80 | HR 100 | Ht 71.0 in | Wt 295.0 lb

## 2018-09-14 DIAGNOSIS — S93401D Sprain of unspecified ligament of right ankle, subsequent encounter: Secondary | ICD-10-CM

## 2018-09-14 NOTE — Progress Notes (Signed)
Progress Note   Patient ID: Carolyn Bonilla, female   DOB: 08-01-1991, 28 y.o.   MRN: [id number]   Chief Complaint  Patient presents with  . Follow-up    Recheck on right ankle, DOI 08-10-18.    28 year old female status post ankle sprain treated with rice method presents complaining of pain when standing    ROS   No Known Allergies   BP 118/80   Pulse 100   Ht 5\' 11"  (1.803 m)   Wt 295 lb (133.8 kg)   BMI 41.14 kg/m   Physical Exam Vitals signs reviewed.  Constitutional:      Appearance: She is well-developed.  Musculoskeletal:       Feet:  Neurological:     Mental Status: She is alert and oriented to person, place, and time.  Psychiatric:        Attention and Perception: Attention normal.        Mood and Affect: Mood and affect normal.        Speech: Speech normal.        Behavior: Behavior normal.        Thought Content: Thought content normal.        Judgment: Judgment normal.      Medical decisions:     Encounter Diagnosis  Name Primary?  . Sprain of right ankle, unspecified ligament, subsequent encounter Yes    PLAN:   Recommend ibuprofen when at work standing some before and some after otherwise follow-up as needed released    Fuller Canada, MD 09/14/2018 4:07 PM

## 2018-09-14 NOTE — Patient Instructions (Signed)
Take ibuprofen before and after work
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
